# Patient Record
Sex: Male | Born: 1950 | Race: Black or African American | Hispanic: No | Marital: Married | Smoking: Never smoker
Health system: Southern US, Community
[De-identification: ages and names within clinical notes are randomized; demographics above are authoritative.]

## PROBLEM LIST (undated history)

## (undated) DIAGNOSIS — G4730 Sleep apnea, unspecified: Secondary | ICD-10-CM

## (undated) DIAGNOSIS — I10 Essential (primary) hypertension: Secondary | ICD-10-CM

## (undated) HISTORY — PX: CORONARY ARTERY BYPASS GRAFT: SHX141

## (undated) HISTORY — DX: Sleep apnea, unspecified: G47.30

---

## 1998-09-16 ENCOUNTER — Encounter: Payer: Self-pay | Admitting: Family Medicine

## 1998-09-16 ENCOUNTER — Ambulatory Visit (HOSPITAL_COMMUNITY): Admission: RE | Admit: 1998-09-16 | Discharge: 1998-09-16 | Payer: Self-pay | Admitting: Family Medicine

## 2001-05-23 ENCOUNTER — Encounter: Admission: RE | Admit: 2001-05-23 | Discharge: 2001-08-21 | Payer: Self-pay | Admitting: Family Medicine

## 2002-01-21 ENCOUNTER — Ambulatory Visit (HOSPITAL_COMMUNITY): Admission: RE | Admit: 2002-01-21 | Discharge: 2002-01-21 | Payer: Self-pay | Admitting: Ophthalmology

## 2002-01-21 ENCOUNTER — Encounter: Payer: Self-pay | Admitting: Family Medicine

## 2002-01-29 ENCOUNTER — Encounter: Payer: Self-pay | Admitting: Family Medicine

## 2002-01-29 ENCOUNTER — Ambulatory Visit (HOSPITAL_COMMUNITY): Admission: RE | Admit: 2002-01-29 | Discharge: 2002-01-29 | Payer: Self-pay | Admitting: Family Medicine

## 2002-02-25 ENCOUNTER — Encounter: Payer: Self-pay | Admitting: Family Medicine

## 2002-02-25 ENCOUNTER — Ambulatory Visit (HOSPITAL_COMMUNITY): Admission: RE | Admit: 2002-02-25 | Discharge: 2002-02-25 | Payer: Self-pay | Admitting: Family Medicine

## 2003-06-23 ENCOUNTER — Encounter: Admission: RE | Admit: 2003-06-23 | Discharge: 2003-09-21 | Payer: Self-pay | Admitting: Family Medicine

## 2003-07-04 ENCOUNTER — Ambulatory Visit (HOSPITAL_COMMUNITY): Admission: RE | Admit: 2003-07-04 | Discharge: 2003-07-04 | Payer: Self-pay | Admitting: Family Medicine

## 2004-06-07 ENCOUNTER — Encounter: Admission: RE | Admit: 2004-06-07 | Discharge: 2004-06-07 | Payer: Self-pay | Admitting: Family Medicine

## 2010-11-03 ENCOUNTER — Other Ambulatory Visit: Payer: Self-pay | Admitting: Gastroenterology

## 2010-11-08 ENCOUNTER — Ambulatory Visit
Admission: RE | Admit: 2010-11-08 | Discharge: 2010-11-08 | Disposition: A | Discharge status: Home / Self Care | Payer: 59 | Source: Ambulatory Visit | Attending: Gastroenterology | Admitting: Gastroenterology

## 2012-02-10 ENCOUNTER — Encounter (HOSPITAL_COMMUNITY): Payer: Self-pay | Admitting: *Deleted

## 2012-02-10 ENCOUNTER — Emergency Department (HOSPITAL_COMMUNITY): Payer: BC Managed Care – PPO

## 2012-02-10 ENCOUNTER — Emergency Department (HOSPITAL_COMMUNITY)
Admission: EM | Admit: 2012-02-10 | Discharge: 2012-02-10 | Disposition: A | Discharge status: Home / Self Care | Payer: BC Managed Care – PPO | Attending: Emergency Medicine | Admitting: Emergency Medicine

## 2012-02-10 DIAGNOSIS — S61209A Unspecified open wound of unspecified finger without damage to nail, initial encounter: Secondary | ICD-10-CM | POA: Insufficient documentation

## 2012-02-10 DIAGNOSIS — S61019A Laceration without foreign body of unspecified thumb without damage to nail, initial encounter: Secondary | ICD-10-CM

## 2012-02-10 DIAGNOSIS — S6990XA Unspecified injury of unspecified wrist, hand and finger(s), initial encounter: Secondary | ICD-10-CM

## 2012-02-10 DIAGNOSIS — W298XXA Contact with other powered powered hand tools and household machinery, initial encounter: Secondary | ICD-10-CM | POA: Insufficient documentation

## 2012-02-10 HISTORY — DX: Essential (primary) hypertension: I10

## 2012-02-10 MED ORDER — TETANUS-DIPHTHERIA TOXOIDS TD 5-2 LFU IM INJ
0.5000 mL | INJECTION | Freq: Once | INTRAMUSCULAR | Status: AC
  Administered 2012-02-10: 0.5 mL via INTRAMUSCULAR
  Filled 2012-02-10: qty 0.5

## 2012-02-10 MED ORDER — HYDROCODONE-ACETAMINOPHEN 5-325 MG PO TABS: 1.0000 | ORAL_TABLET | Freq: Four times a day (QID) | ORAL | Status: AC | PRN

## 2012-02-10 MED ORDER — HYDROCODONE-ACETAMINOPHEN 5-325 MG PO TABS
1.0000 | ORAL_TABLET | Freq: Once | ORAL | Status: AC
  Administered 2012-02-10: 1 via ORAL
  Filled 2012-02-10: qty 1

## 2012-02-10 NOTE — ED Provider Notes (Signed)
History   This chart was scribed for Celene Kras, MD by Shari Heritage. The patient was seen in room TR05C/TR05C. Patient's care was started at 1506.     CSN: [id number]  Arrival date & time 02/10/12  1506   None     Chief Complaint  Patient presents with  . Extremity Laceration    (Consider location/radiation/quality/duration/timing/severity/associated sxs/prior treatment) Patient is a 61 y.o. male presenting with skin laceration. The history is provided by the patient. No language interpreter was used.  Laceration  The incident occurred less than 1 hour ago. The laceration is located on the left hand and right hand. The laceration is 1 cm in size. The laceration mechanism was a a metal edge Child psychotherapist). The pain is moderate. The pain has been constant since onset. He reports no foreign bodies present. His tetanus status is unknown.   Brandon Melendez is a 61 y.o. male who presents to the Emergency Department complaining of laceration to thumbs bilaterally. Patient has a laceration under nail of right thumb and laceration on top of nail of left thumb. Patient's wife was cutting hedges with an Education officer, museum. Patient says that hedge trimmers were fully engaged and plugged into a power source when he was cut. Patient says his tetanus is questionably UTD. Patient with h/o of HTN and diabetes. Patient has never smoked.  Past Medical History  Diagnosis Date  . Hypertension   . Diabetes mellitus     No past surgical history on file.  No family history on file.  History  Substance Use Topics  . Smoking status: Never Smoker   . Smokeless tobacco: Not on file  . Alcohol Use: No      Review of Systems  Constitutional: Negative for fever.  HENT: Negative for neck pain.   Eyes: Negative for visual disturbance.  Respiratory: Negative for cough.   Cardiovascular: Negative for chest pain.  Gastrointestinal: Negative for abdominal pain.  Genitourinary: Negative for  frequency.  Musculoskeletal: Negative for back pain.  Skin: Positive for wound.  Neurological: Negative for headaches.  Psychiatric/Behavioral: Negative for confusion.    Allergies  Review of patient's allergies indicates no known allergies.  Home Medications   Current Outpatient Rx  Name Route Sig Dispense Refill  . ATORVASTATIN CALCIUM 10 MG PO TABS Oral Take 10 mg by mouth daily.    Marland Kitchen GLIMEPIRIDE 4 MG PO TABS Oral Take 4 mg by mouth 2 (two) times daily.    Marland Kitchen LISINOPRIL 20 MG PO TABS Oral Take 20 mg by mouth daily.    Marland Kitchen METFORMIN HCL 500 MG PO TABS Oral Take 1,000 mg by mouth 2 (two) times daily with a meal.    . JANUVIA PO Oral Take 1 tablet by mouth daily.      BP 147/79  Pulse 114  Temp 98.4 F (36.9 C) (Oral)  Resp 14  SpO2 97%  Physical Exam  Nursing note and vitals reviewed. Constitutional: He appears well-developed and well-nourished. No distress.  HENT:  Head: Normocephalic and atraumatic.  Right Ear: External ear normal.  Left Ear: External ear normal.  Eyes: Conjunctivae are normal. Right eye exhibits no discharge. Left eye exhibits no discharge. No scleral icterus.  Neck: Neck supple. No tracheal deviation present.  Cardiovascular: Normal rate.   Pulmonary/Chest: Effort normal. No stridor. No respiratory distress.  Musculoskeletal: He exhibits no edema.       Hands:      Both thumbs with injury to nail with superficial laceration  of the distal aspect of the thumb and nail matrix. Portion of nail avulsed on left thumbnail.  Neurological: He is alert. Cranial nerve deficit: no gross deficits.  Skin: Skin is warm and dry. No rash noted.  Psychiatric: He has a normal mood and affect.    ED Course  Procedures (including critical care time) DIAGNOSTIC STUDIES: Oxygen Saturation is 97% on room air, adequate by my interpretation.    COORDINATION OF CARE: 3:50PM- Patient informed of current plan for treatment and evaluation and agrees with plan at this time.  Will order X-ray of hands to check for fractures. Will also prescribe pain medication.  5:08PM- Performed clipping of fingernails of thumbs bilaterally and jagged edges were reduced.   Labs Reviewed - No data to display  Dg Finger Thumb Left  02/10/2012  *RADIOLOGY REPORT*  Clinical Data: Laceration.  LEFT THUMB 2+V  Comparison: None.  Findings: No acute osseous or joint abnormality.  No radiopaque foreign body.  IMPRESSION: No acute findings.  Original Report Authenticated By: Reyes Ivan, M.D.   Dg Finger Thumb Right  02/10/2012  *RADIOLOGY REPORT*  Clinical Data: Laceration.  RIGHT THUMB 2+V  Comparison: None.  Findings: No acute osseous or joint abnormality.  No radiopaque foreign body.  IMPRESSION: No acute findings.  Original Report Authenticated By: Reyes Ivan, M.D.     1. Fingernail injury   2. Thumb laceration       MDM  No need for suture.  No fracture.  Local wound care provided and nails debrided.      I personally performed the services described in this documentation, which was scribed in my presence.  The recorded information has been reviewed and considered.    Celene Kras, MD 02/10/12 [phone]

## 2012-02-10 NOTE — ED Notes (Signed)
Lacerated both thumbs with a hedge trimmer while blade was engaged. - rt. Thumb: lac. Under thumb nail; lt. Thumb: lac. On top of nail. Controlled bleeding.

## 2013-05-04 IMAGING — CR DG FINGER THUMB 2+V*L*
3 series · 3 of 3 positions shown · non-contrast
Comparison: None.

CLINICAL DATA: Laceration.

LEFT THUMB 2+V

[x finger pa left]
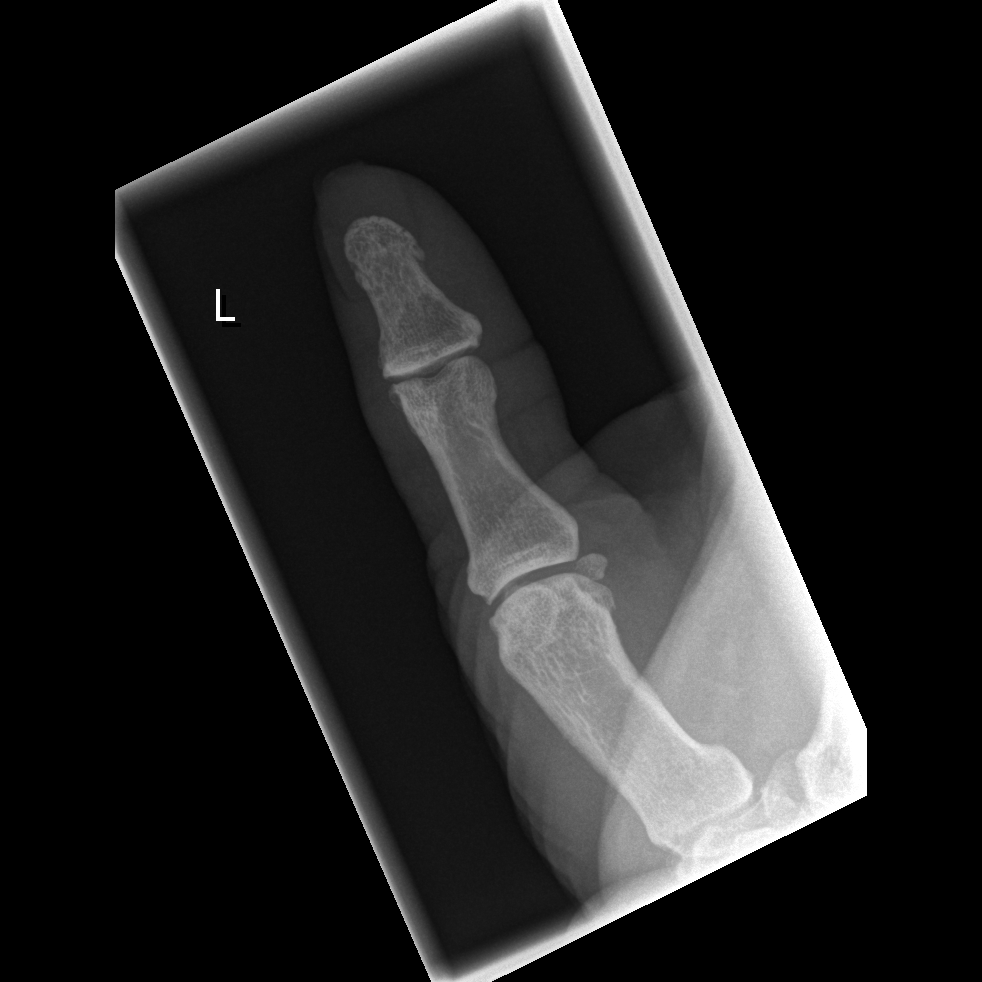

[x finger obl. left]
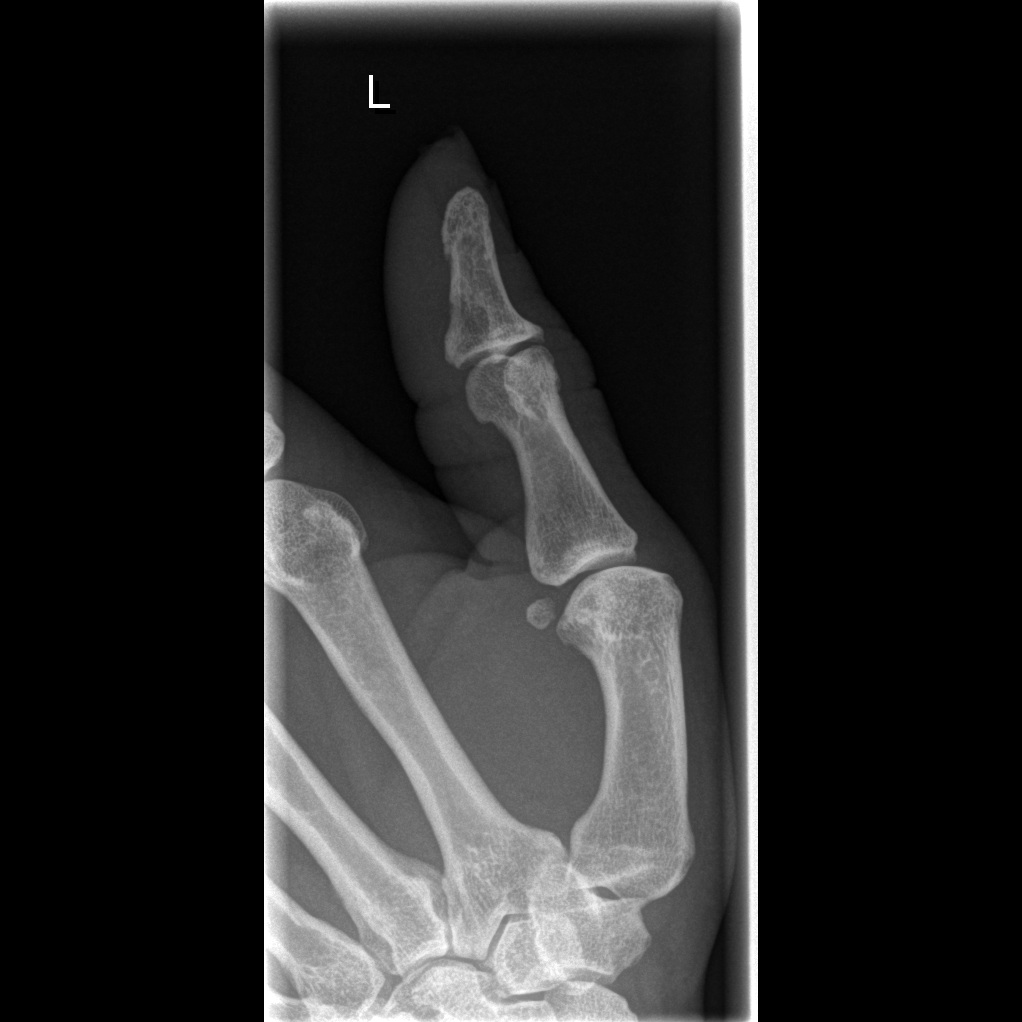

[x finger lateral left]
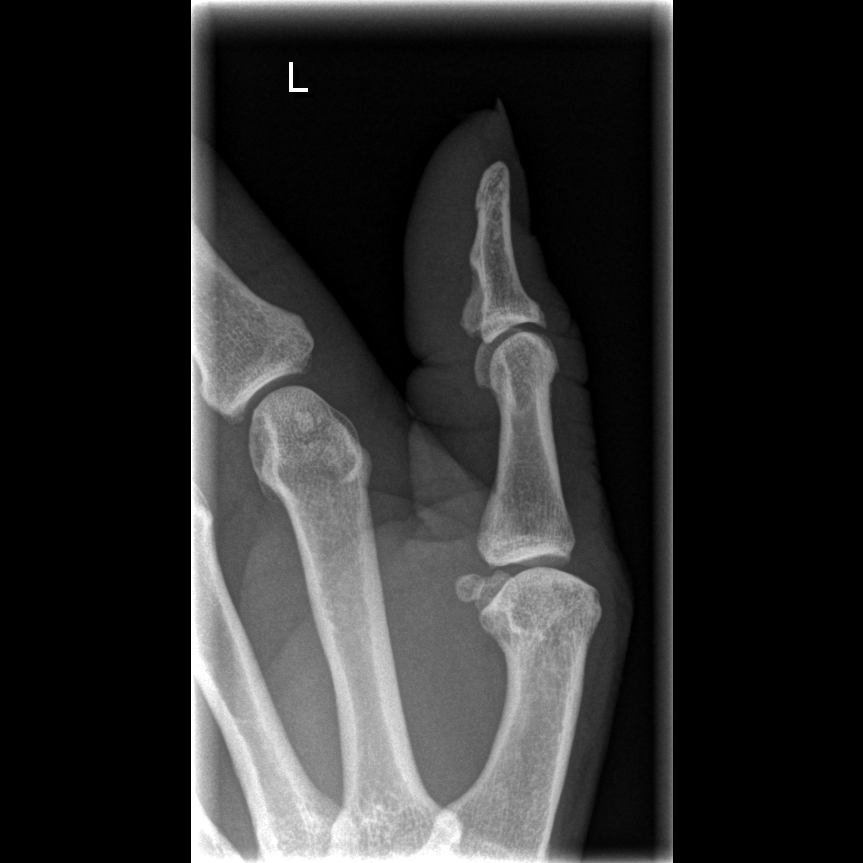

[3 of 3 positions shown; findings below may reference images not displayed]

FINDINGS: No acute osseous or joint abnormality.  No radiopaque
foreign body.
IMPRESSION: No acute findings.

## 2013-10-10 ENCOUNTER — Encounter: Payer: Self-pay | Admitting: Dietician

## 2013-10-10 ENCOUNTER — Encounter: Payer: BC Managed Care – PPO | Attending: Family Medicine | Admitting: Dietician

## 2013-10-10 VITALS — Ht 68.5 in | Wt 207.3 lb

## 2013-10-10 DIAGNOSIS — Z713 Dietary counseling and surveillance: Secondary | ICD-10-CM | POA: Insufficient documentation

## 2013-10-10 DIAGNOSIS — E119 Type 2 diabetes mellitus without complications: Secondary | ICD-10-CM | POA: Insufficient documentation

## 2013-10-10 NOTE — Patient Instructions (Addendum)
-  Watch portions; read labels -Eat more consistently, develop a routine. Keep snacks on hand.  -1 serving of carb = 15 grams  -3 servings of carb per meal -1 serving of carb at snacks  -Keep a food record  -Get back into a physical activity routine

## 2013-10-10 NOTE — Progress Notes (Signed)
  Medical Nutrition Therapy:  Appt start time: 0830 end time:  1000.   Assessment:  Primary concerns today: Brandon Melendez is here today in hopes of improving his "bad eating habits" and getting his diabetes under better control. He reports that he was diagnosed with diabetes around the year 1999 or 2000. He seems to have a good understanding of the disease process of diabetes and the negative effects of uncontrolled diabetes, as he has had diabetes education in the past. He states that he is currently participating in an insulin study. Per his report, Brandon Melendez has an inconsistent eating schedule. 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 He has a sedentary lifestyle and does not do any physical activity. Brandon Melendez reports he was in the [employer] for 34 years.  Checks blood sugars 1 time a day in the morning: average 125-135, some highs over 200 and 1 low below 70.  Preferred Learning Style:  No preference indicated   Learning Readiness:  Contemplating  MEDICATIONS: see list; participating in an insulin study  Usual physical activity: none  Estimated energy needs: 2000 calories 225 g carbohydrates  Progress Towards Goal(s):  No progress.   Nutritional Diagnosis:  [place]-2.2 Altered nutrition-related laboratory As related to excessive carbohydrate intake, overweight, physical inactivity.  As evidenced by HgbA1c above normal limits per patient report.    Intervention:  Nutrition counseling provided.  Goals: -Watch portions; read labels -Eat more consistently, develop a routine. Keep snacks on hand. -1 serving of carb = 15 grams -3 servings of carb per meal -1 serving of carb at snacks -Keep a food record -Get back into a physical activity routine  Teaching Method Utilized: Visual Auditory Hands on  Handouts given during visit include:  Living Well with Diabetes booklet  15g CHO + protein snacks  Nutrition Label  Barriers to learning/adherence to  lifestyle change: food preferences  Demonstrated degree of understanding via:  Teach Back   Monitoring/Evaluation:  Dietary intake, exercise, carb counting, and body weight in 4 week(s).

## 2013-10-22 ENCOUNTER — Ambulatory Visit: Payer: BC Managed Care – PPO | Admitting: Dietician

## 2013-11-08 ENCOUNTER — Ambulatory Visit: Payer: BC Managed Care – PPO | Admitting: Dietician

## 2013-11-12 ENCOUNTER — Ambulatory Visit: Payer: BC Managed Care – PPO | Admitting: Dietician

## 2014-02-06 ENCOUNTER — Other Ambulatory Visit (HOSPITAL_COMMUNITY): Payer: Self-pay | Admitting: Orthopedic Surgery

## 2014-02-06 DIAGNOSIS — M7989 Other specified soft tissue disorders: Secondary | ICD-10-CM

## 2014-02-06 DIAGNOSIS — M79605 Pain in left leg: Secondary | ICD-10-CM

## 2014-02-07 ENCOUNTER — Ambulatory Visit (HOSPITAL_COMMUNITY)
Admission: RE | Admit: 2014-02-07 | Discharge: 2014-02-07 | Disposition: A | Discharge status: Home / Self Care | Payer: BC Managed Care – PPO | Source: Ambulatory Visit | Attending: Orthopedic Surgery | Admitting: Orthopedic Surgery

## 2014-02-07 DIAGNOSIS — M79609 Pain in unspecified limb: Secondary | ICD-10-CM | POA: Insufficient documentation

## 2014-02-07 DIAGNOSIS — M7989 Other specified soft tissue disorders: Secondary | ICD-10-CM

## 2014-02-07 DIAGNOSIS — M79605 Pain in left leg: Secondary | ICD-10-CM

## 2014-02-07 NOTE — Progress Notes (Signed)
*  PRELIMINARY RESULTS* Vascular Ultrasound Right lower extremity venous duplex has been completed.  Preliminary findings: no evidence of DVT or superficial thrombosis. Area of fluid collection is noted in right mid calf, consistent with muscle tear.   Called results.  Farrel DemarkJill Eunice, RDMS, RVT  02/07/2014, 10:33 AM

## 2014-11-06 ENCOUNTER — Encounter (HOSPITAL_COMMUNITY)
Admission: RE | Admit: 2014-11-06 | Discharge: 2014-11-06 | Disposition: A | Discharge status: Home / Self Care | Payer: Non-veteran care | Source: Ambulatory Visit | Attending: Cardiology | Admitting: Cardiology

## 2014-11-06 DIAGNOSIS — E119 Type 2 diabetes mellitus without complications: Secondary | ICD-10-CM | POA: Insufficient documentation

## 2014-11-06 DIAGNOSIS — Z48812 Encounter for surgical aftercare following surgery on the circulatory system: Secondary | ICD-10-CM | POA: Insufficient documentation

## 2014-11-06 DIAGNOSIS — Z794 Long term (current) use of insulin: Secondary | ICD-10-CM | POA: Insufficient documentation

## 2014-11-06 DIAGNOSIS — Z79899 Other long term (current) drug therapy: Secondary | ICD-10-CM | POA: Insufficient documentation

## 2014-11-06 DIAGNOSIS — Z951 Presence of aortocoronary bypass graft: Secondary | ICD-10-CM | POA: Insufficient documentation

## 2014-11-06 NOTE — Progress Notes (Signed)
Cardiac Rehab Medication Review by a Pharmacist  Does the patient  feel that his/her medications are working for him/her?  yes  Has the patient been experiencing any side effects to the medications prescribed?  no  Does the patient measure his/her own blood pressure or blood glucose at home?  yes   Does the patient have any problems obtaining medications due to transportation or finances?   no  Understanding of regimen: good Understanding of indications: excellent Potential of compliance: good  Pharmacist comments: Patient has no barriers to obtaining his medications. He does check his blood sugar at home, but not his blood pressure.  His medications have been updated in the EMR.  He is not experiencing any side effects. He has no questions for me.  Cassie L. Roseanne RenoStewart, PharmD Clinical Pharmacy Resident Pager: [phone] 11/06/2014 8:56 AM

## 2014-11-12 ENCOUNTER — Encounter (HOSPITAL_COMMUNITY)
Admission: RE | Admit: 2014-11-12 | Discharge: 2014-11-12 | Disposition: A | Discharge status: Home / Self Care | Payer: Non-veteran care | Source: Ambulatory Visit | Attending: Cardiology | Admitting: Cardiology

## 2014-11-12 DIAGNOSIS — Z951 Presence of aortocoronary bypass graft: Secondary | ICD-10-CM | POA: Diagnosis not present

## 2014-11-12 DIAGNOSIS — Z79899 Other long term (current) drug therapy: Secondary | ICD-10-CM | POA: Diagnosis not present

## 2014-11-12 DIAGNOSIS — E119 Type 2 diabetes mellitus without complications: Secondary | ICD-10-CM | POA: Diagnosis not present

## 2014-11-12 DIAGNOSIS — Z48812 Encounter for surgical aftercare following surgery on the circulatory system: Secondary | ICD-10-CM | POA: Diagnosis not present

## 2014-11-12 DIAGNOSIS — Z794 Long term (current) use of insulin: Secondary | ICD-10-CM | POA: Diagnosis not present

## 2014-11-12 LAB — GLUCOSE, CAPILLARY
GLUCOSE-CAPILLARY: 121 mg/dL — AB (ref 70–99)
Glucose-Capillary: 105 mg/dL — ABNORMAL HIGH (ref 70–99)

## 2014-11-12 NOTE — Progress Notes (Signed)
Pt started cardiac rehab today.  Pt tolerated light exercise without difficulty. Telemetry rhythm Sinus. Vital signs stable. Brandon Melendez's short term goals are to lose weight. Brandon Melendez's long term goals are to lose 10-15 pounds, lower his AIC, improve diet and get down to 175 pounds. PHQ=0. Will continue to monitor the patient throughout  the program.

## 2014-11-14 ENCOUNTER — Encounter (HOSPITAL_COMMUNITY)
Admission: RE | Admit: 2014-11-14 | Discharge: 2014-11-14 | Disposition: A | Discharge status: Home / Self Care | Payer: Non-veteran care | Source: Ambulatory Visit | Attending: Cardiology | Admitting: Cardiology

## 2014-11-14 DIAGNOSIS — Z48812 Encounter for surgical aftercare following surgery on the circulatory system: Secondary | ICD-10-CM | POA: Diagnosis not present

## 2014-11-14 LAB — GLUCOSE, CAPILLARY
GLUCOSE-CAPILLARY: 122 mg/dL — AB (ref 70–99)
Glucose-Capillary: 66 mg/dL — ABNORMAL LOW (ref 70–99)
Glucose-Capillary: 71 mg/dL (ref 70–99)

## 2014-11-17 ENCOUNTER — Encounter (HOSPITAL_COMMUNITY)
Admission: RE | Admit: 2014-11-17 | Discharge: 2014-11-17 | Disposition: A | Discharge status: Home / Self Care | Payer: Non-veteran care | Source: Ambulatory Visit | Attending: Cardiology | Admitting: Cardiology

## 2014-11-17 DIAGNOSIS — Z48812 Encounter for surgical aftercare following surgery on the circulatory system: Secondary | ICD-10-CM | POA: Diagnosis not present

## 2014-11-17 LAB — GLUCOSE, CAPILLARY
GLUCOSE-CAPILLARY: 188 mg/dL — AB (ref 70–99)
GLUCOSE-CAPILLARY: 178 mg/dL — AB (ref 70–99)

## 2014-11-17 NOTE — Progress Notes (Signed)
Academic intern reviewed home exercise with pt today.  Pt plans to ... For exercise.  Reviewed THR, pulse, RPE, sign and symptoms, and when to call 911 or MD.  Pt voiced understanding. Debby FreibergLee Griswold, Academic Intern Fabio PierceJessica Leylani Duley, MA, ACSM RCEP

## 2014-11-19 ENCOUNTER — Encounter (HOSPITAL_COMMUNITY)
Admission: RE | Admit: 2014-11-19 | Discharge: 2014-11-19 | Disposition: A | Discharge status: Home / Self Care | Payer: Non-veteran care | Source: Ambulatory Visit | Attending: Cardiology | Admitting: Cardiology

## 2014-11-19 DIAGNOSIS — Z48812 Encounter for surgical aftercare following surgery on the circulatory system: Secondary | ICD-10-CM | POA: Diagnosis not present

## 2014-11-19 LAB — GLUCOSE, CAPILLARY
Glucose-Capillary: 68 mg/dL — ABNORMAL LOW (ref 70–99)
Glucose-Capillary: 80 mg/dL (ref 70–99)
Glucose-Capillary: 81 mg/dL (ref 70–99)

## 2014-11-19 NOTE — Progress Notes (Addendum)
CBG 68. Patient asymptomatic given a ginger ale. Repeat CBG 80. Patient did not exercise today per protocol. Patient was counseled by the dietitian. Patient was also given some graham crackers. Recheck CBG=81. [name] low CB[facility] this afternoon. Will fax exercise flow sheets [facility]. Patient advised to check CBG before coming to exercise on Friday.Patient states understanding. Psychosocial assessment: [name]'s father passed away this past November. Patient is not grieving at this time. Will continue to provide emotional support as needed.

## 2014-11-19 NOTE — Progress Notes (Signed)
Nutrition Note Spoke with pt. Pt pre-exercise CBG too low to exercise after eating a pork chop and potatoes with gravy and greens. Pt reports injecting 4 units of insulin prior to eating. Pre-meal CBG was reportedly 108 mg/dL. If pre-exercise CBG's remain too low despite eating, consider having pt hold insulin before lunch on exercise days. Pt expressed understanding of the information reviewed. Continue client-centered nutrition education by RD as part of interdisciplinary care.  Monitor and evaluate progress toward nutrition goal with team.  Mickle PlumbEdna Roan Miklos, M.Ed, RD, LDN, CDE 11/19/2014 4:03 PM

## 2014-11-21 ENCOUNTER — Encounter (HOSPITAL_COMMUNITY)
Admission: RE | Admit: 2014-11-21 | Discharge: 2014-11-21 | Disposition: A | Discharge status: Home / Self Care | Payer: Non-veteran care | Source: Ambulatory Visit | Attending: Cardiology | Admitting: Cardiology

## 2014-11-21 DIAGNOSIS — Z48812 Encounter for surgical aftercare following surgery on the circulatory system: Secondary | ICD-10-CM | POA: Diagnosis not present

## 2014-11-21 LAB — GLUCOSE, CAPILLARY
GLUCOSE-CAPILLARY: 133 mg/dL — AB (ref 70–99)
Glucose-Capillary: 166 mg/dL — ABNORMAL HIGH (ref 70–99)

## 2014-11-24 ENCOUNTER — Encounter (HOSPITAL_COMMUNITY)
Admission: RE | Admit: 2014-11-24 | Discharge: 2014-11-24 | Disposition: A | Discharge status: Home / Self Care | Payer: Non-veteran care | Source: Ambulatory Visit | Attending: Cardiology | Admitting: Cardiology

## 2014-11-24 DIAGNOSIS — Z48812 Encounter for surgical aftercare following surgery on the circulatory system: Secondary | ICD-10-CM | POA: Diagnosis not present

## 2014-11-24 LAB — GLUCOSE, CAPILLARY
GLUCOSE-CAPILLARY: 116 mg/dL — AB (ref 70–99)
Glucose-Capillary: 147 mg/dL — ABNORMAL HIGH (ref 70–99)

## 2014-11-26 ENCOUNTER — Encounter (HOSPITAL_COMMUNITY)
Admission: RE | Admit: 2014-11-26 | Discharge: 2014-11-26 | Disposition: A | Discharge status: Home / Self Care | Payer: Non-veteran care | Source: Ambulatory Visit | Attending: Cardiology | Admitting: Cardiology

## 2014-11-26 DIAGNOSIS — Z48812 Encounter for surgical aftercare following surgery on the circulatory system: Secondary | ICD-10-CM | POA: Diagnosis not present

## 2014-11-26 LAB — GLUCOSE, CAPILLARY
GLUCOSE-CAPILLARY: 83 mg/dL (ref 70–99)
GLUCOSE-CAPILLARY: 99 mg/dL (ref 70–99)

## 2014-11-26 NOTE — Progress Notes (Signed)
Nutrition Note Spoke with pt. Pt pre-exercise CBG 83 mg/dL after eating pancakes, syrup, [place] bacon, and juice. Pt held his Novolog before lunch. DM medications discussed. Pt is going to try holding Metformin and Novolog on Friday before exercise. Pt to call with CBG prior to coming to cardiac rehab. If holding Metformin and Novolog is not effective, consider decreasing bedtime dose of Lantus from 25 units to 20. Pt expressed understanding of the information reviewed. Brandon LighterMaria Whitaker, RN is awarContinue client-centered nutrition education by RD as part of interdisciplinary care.  Monitor and evaluate progress toward nutrition goal with team.  Brandon PlumbEdna Mikiah Melendez, M.Ed, RD, LDN, CDE 11/26/2014 3:44 PM

## 2014-11-26 NOTE — Progress Notes (Signed)
Patient CBG this afternoon was 83. No exercise today per protocol. Patient given lemonade and a banana. Recheck CBG 99. Patient plans to return to exercise on Friday. Patient was counseled by the dietitian.

## 2014-11-28 ENCOUNTER — Encounter (HOSPITAL_COMMUNITY)
Admission: RE | Admit: 2014-11-28 | Discharge: 2014-11-28 | Disposition: A | Discharge status: Home / Self Care | Payer: Non-veteran care | Source: Ambulatory Visit | Attending: Cardiology | Admitting: Cardiology

## 2014-11-28 DIAGNOSIS — Z48812 Encounter for surgical aftercare following surgery on the circulatory system: Secondary | ICD-10-CM | POA: Diagnosis not present

## 2014-11-28 LAB — GLUCOSE, CAPILLARY
GLUCOSE-CAPILLARY: 225 mg/dL — AB (ref 70–99)
Glucose-Capillary: 185 mg/dL — ABNORMAL HIGH (ref 70–99)

## 2014-12-01 ENCOUNTER — Encounter (HOSPITAL_COMMUNITY)
Admission: RE | Admit: 2014-12-01 | Discharge: 2014-12-01 | Disposition: A | Discharge status: Home / Self Care | Payer: Non-veteran care | Source: Ambulatory Visit | Attending: Cardiology | Admitting: Cardiology

## 2014-12-01 DIAGNOSIS — Z794 Long term (current) use of insulin: Secondary | ICD-10-CM | POA: Diagnosis not present

## 2014-12-01 DIAGNOSIS — Z48812 Encounter for surgical aftercare following surgery on the circulatory system: Secondary | ICD-10-CM | POA: Diagnosis present

## 2014-12-01 DIAGNOSIS — Z951 Presence of aortocoronary bypass graft: Secondary | ICD-10-CM | POA: Diagnosis not present

## 2014-12-01 DIAGNOSIS — E119 Type 2 diabetes mellitus without complications: Secondary | ICD-10-CM | POA: Diagnosis not present

## 2014-12-01 DIAGNOSIS — Z79899 Other long term (current) drug therapy: Secondary | ICD-10-CM | POA: Diagnosis not present

## 2014-12-03 ENCOUNTER — Encounter (HOSPITAL_COMMUNITY)
Admission: RE | Admit: 2014-12-03 | Discharge: 2014-12-03 | Disposition: A | Discharge status: Home / Self Care | Payer: Non-veteran care | Source: Ambulatory Visit | Attending: Cardiology | Admitting: Cardiology

## 2014-12-03 DIAGNOSIS — Z48812 Encounter for surgical aftercare following surgery on the circulatory system: Secondary | ICD-10-CM | POA: Diagnosis not present

## 2014-12-05 ENCOUNTER — Encounter (HOSPITAL_COMMUNITY)
Admission: RE | Admit: 2014-12-05 | Discharge: 2014-12-05 | Disposition: A | Discharge status: Home / Self Care | Payer: Non-veteran care | Source: Ambulatory Visit | Attending: Cardiology | Admitting: Cardiology

## 2014-12-05 DIAGNOSIS — Z48812 Encounter for surgical aftercare following surgery on the circulatory system: Secondary | ICD-10-CM | POA: Diagnosis not present

## 2014-12-08 ENCOUNTER — Encounter (HOSPITAL_COMMUNITY)
Admission: RE | Admit: 2014-12-08 | Discharge: 2014-12-08 | Disposition: A | Discharge status: Home / Self Care | Payer: Non-veteran care | Source: Ambulatory Visit | Attending: Cardiology | Admitting: Cardiology

## 2014-12-08 DIAGNOSIS — Z48812 Encounter for surgical aftercare following surgery on the circulatory system: Secondary | ICD-10-CM | POA: Diagnosis not present

## 2014-12-10 ENCOUNTER — Encounter (HOSPITAL_COMMUNITY)
Admission: RE | Admit: 2014-12-10 | Discharge: 2014-12-10 | Disposition: A | Discharge status: Home / Self Care | Payer: Non-veteran care | Source: Ambulatory Visit | Attending: Cardiology | Admitting: Cardiology

## 2014-12-10 DIAGNOSIS — Z48812 Encounter for surgical aftercare following surgery on the circulatory system: Secondary | ICD-10-CM | POA: Diagnosis not present

## 2014-12-12 ENCOUNTER — Encounter (HOSPITAL_COMMUNITY)
Admission: RE | Admit: 2014-12-12 | Discharge: 2014-12-12 | Disposition: A | Discharge status: Home / Self Care | Payer: Non-veteran care | Source: Ambulatory Visit | Attending: Cardiology | Admitting: Cardiology

## 2014-12-12 DIAGNOSIS — Z48812 Encounter for surgical aftercare following surgery on the circulatory system: Secondary | ICD-10-CM | POA: Diagnosis not present

## 2014-12-15 ENCOUNTER — Encounter (HOSPITAL_COMMUNITY)
Admission: RE | Admit: 2014-12-15 | Discharge: 2014-12-15 | Disposition: A | Discharge status: Home / Self Care | Payer: Non-veteran care | Source: Ambulatory Visit | Attending: Cardiology | Admitting: Cardiology

## 2014-12-15 DIAGNOSIS — Z48812 Encounter for surgical aftercare following surgery on the circulatory system: Secondary | ICD-10-CM | POA: Diagnosis not present

## 2014-12-15 NOTE — Progress Notes (Signed)
Brandon Melendez 64 y.o. male Nutrition Note Spoke with pt. Nutrition Plan and Nutrition Survey goals reviewed with pt. Pt is following Step 1 of the Therapeutic Lifestyle Changes diet. Pt wants to lose wt. Per pt, pre-op UBW was 217 lb. Pt wt today 84.7 kg, which is down 3.9 kg (8.6 lb) over the past 4 weeks. Rate of wt loss appears safe. Wt loss tips reviewed.  Pt is diabetic. Last A1c indicates blood glucose well-controlled. Pt states his A1c re-check 2-3 weeks ago was 5.7. Pt is now holding his Novolog on exercise days. Pt checks CBG's TID. Fasting CBG's reportedly "in the 90's." This writer went over Diabetes Education test results. Pt expressed understanding of the information reviewed. Pt aware of nutrition education classes offered and plans on attending nutrition classes. No results found for: HGBA1C Nutrition Diagnosis ? Food-and nutrition-related knowledge deficit related to lack of exposure to information as related to diagnosis of: ? CVD ? DM  ? Overweight related to excessive energy intake as evidenced by a BMI of 29.9  Nutrition RX/ Estimated Daily Nutrition Needs for: wt loss 1500-2000 Kcal, 40-55 gm fat, 9-13 gm sat fat, 1.4-2.0 gm trans-fat, <1500 mg sodium, 175-250 gm CHO   Nutrition Intervention ? Pt's individual nutrition plan reviewed with pt. ? Benefits of adopting Therapeutic Lifestyle Changes discussed when Medficts reviewed. ? Pt to attend the Portion Distortion class ? Pt to attend the Diabetes Q & A class ? Pt to attend the   ? Nutrition I class                   ? Nutrition II class     ? Diabetes Blitz class ? Continue client-centered nutrition education by RD, as part of interdisciplinary care. Goal(s) ? Pt to identify and limit food sources of saturated fat, trans fat, and cholesterol ? Pt to identify food quantities necessary to achieve: ? wt loss to a goal wt of 175-180 lb (79.5-81.8 kg) at graduation from cardiac rehab.  ? Use pre-meal and post-meal CBG's and A1c  to determine whether adjustments in food/meal planning will be beneficial or if any meds need to be combined with nutrition therapy. Monitor and Evaluate progress toward nutrition goal with team. Nutrition Risk: Change to Moderate Mickle PlumbEdna Caylah Plouff, M.Ed, RD, LDN, CDE 12/15/2014 3:41 PM

## 2014-12-17 ENCOUNTER — Encounter (HOSPITAL_COMMUNITY): Payer: Non-veteran care

## 2014-12-19 ENCOUNTER — Encounter (HOSPITAL_COMMUNITY)
Admission: RE | Admit: 2014-12-19 | Discharge: 2014-12-19 | Disposition: A | Discharge status: Home / Self Care | Payer: Non-veteran care | Source: Ambulatory Visit | Attending: Cardiology | Admitting: Cardiology

## 2014-12-19 DIAGNOSIS — Z48812 Encounter for surgical aftercare following surgery on the circulatory system: Secondary | ICD-10-CM | POA: Diagnosis not present

## 2014-12-22 ENCOUNTER — Encounter (HOSPITAL_COMMUNITY)
Admission: RE | Admit: 2014-12-22 | Discharge: 2014-12-22 | Disposition: A | Discharge status: Home / Self Care | Payer: Non-veteran care | Source: Ambulatory Visit | Attending: Cardiology | Admitting: Cardiology

## 2014-12-22 DIAGNOSIS — Z48812 Encounter for surgical aftercare following surgery on the circulatory system: Secondary | ICD-10-CM | POA: Diagnosis not present

## 2014-12-24 ENCOUNTER — Encounter (HOSPITAL_COMMUNITY)
Admission: RE | Admit: 2014-12-24 | Discharge: 2014-12-24 | Disposition: A | Discharge status: Home / Self Care | Payer: Non-veteran care | Source: Ambulatory Visit | Attending: Cardiology | Admitting: Cardiology

## 2014-12-24 DIAGNOSIS — Z48812 Encounter for surgical aftercare following surgery on the circulatory system: Secondary | ICD-10-CM | POA: Diagnosis not present

## 2014-12-26 ENCOUNTER — Encounter (HOSPITAL_COMMUNITY)
Admission: RE | Admit: 2014-12-26 | Discharge: 2014-12-26 | Disposition: A | Discharge status: Home / Self Care | Payer: Non-veteran care | Source: Ambulatory Visit | Attending: Cardiology | Admitting: Cardiology

## 2014-12-26 DIAGNOSIS — Z48812 Encounter for surgical aftercare following surgery on the circulatory system: Secondary | ICD-10-CM | POA: Diagnosis not present

## 2014-12-31 ENCOUNTER — Encounter (HOSPITAL_COMMUNITY)
Admission: RE | Admit: 2014-12-31 | Discharge: 2014-12-31 | Disposition: A | Discharge status: Home / Self Care | Payer: Non-veteran care | Source: Ambulatory Visit | Attending: Cardiology | Admitting: Cardiology

## 2014-12-31 DIAGNOSIS — Z951 Presence of aortocoronary bypass graft: Secondary | ICD-10-CM | POA: Insufficient documentation

## 2014-12-31 DIAGNOSIS — Z48812 Encounter for surgical aftercare following surgery on the circulatory system: Secondary | ICD-10-CM | POA: Insufficient documentation

## 2014-12-31 DIAGNOSIS — Z794 Long term (current) use of insulin: Secondary | ICD-10-CM | POA: Insufficient documentation

## 2014-12-31 DIAGNOSIS — Z79899 Other long term (current) drug therapy: Secondary | ICD-10-CM | POA: Insufficient documentation

## 2014-12-31 DIAGNOSIS — E119 Type 2 diabetes mellitus without complications: Secondary | ICD-10-CM | POA: Insufficient documentation

## 2015-01-02 ENCOUNTER — Encounter (HOSPITAL_COMMUNITY): Payer: Non-veteran care

## 2015-01-02 ENCOUNTER — Telehealth (HOSPITAL_COMMUNITY): Payer: Self-pay | Admitting: Family Medicine

## 2015-01-05 ENCOUNTER — Encounter (HOSPITAL_COMMUNITY)
Admission: RE | Admit: 2015-01-05 | Discharge: 2015-01-05 | Disposition: A | Discharge status: Home / Self Care | Payer: Non-veteran care | Source: Ambulatory Visit | Attending: Cardiology | Admitting: Cardiology

## 2015-01-05 DIAGNOSIS — Z48812 Encounter for surgical aftercare following surgery on the circulatory system: Secondary | ICD-10-CM | POA: Diagnosis not present

## 2015-01-07 ENCOUNTER — Encounter (HOSPITAL_COMMUNITY)
Admission: RE | Admit: 2015-01-07 | Discharge: 2015-01-07 | Disposition: A | Discharge status: Home / Self Care | Payer: Non-veteran care | Source: Ambulatory Visit | Attending: Cardiology | Admitting: Cardiology

## 2015-01-07 DIAGNOSIS — Z48812 Encounter for surgical aftercare following surgery on the circulatory system: Secondary | ICD-10-CM | POA: Diagnosis not present

## 2015-01-09 ENCOUNTER — Encounter (HOSPITAL_COMMUNITY)
Admission: RE | Admit: 2015-01-09 | Discharge: 2015-01-09 | Disposition: A | Discharge status: Home / Self Care | Payer: Non-veteran care | Source: Ambulatory Visit | Attending: Cardiology | Admitting: Cardiology

## 2015-01-09 DIAGNOSIS — Z48812 Encounter for surgical aftercare following surgery on the circulatory system: Secondary | ICD-10-CM | POA: Diagnosis not present

## 2015-01-12 ENCOUNTER — Encounter (HOSPITAL_COMMUNITY): Payer: Non-veteran care

## 2015-01-14 ENCOUNTER — Encounter (HOSPITAL_COMMUNITY)
Admission: RE | Admit: 2015-01-14 | Discharge: 2015-01-14 | Disposition: A | Discharge status: Home / Self Care | Payer: Non-veteran care | Source: Ambulatory Visit | Attending: Cardiology | Admitting: Cardiology

## 2015-01-14 DIAGNOSIS — Z48812 Encounter for surgical aftercare following surgery on the circulatory system: Secondary | ICD-10-CM | POA: Diagnosis not present

## 2015-01-16 ENCOUNTER — Encounter (HOSPITAL_COMMUNITY): Payer: Non-veteran care

## 2015-01-19 ENCOUNTER — Encounter (HOSPITAL_COMMUNITY)
Admission: RE | Admit: 2015-01-19 | Discharge: 2015-01-19 | Disposition: A | Discharge status: Home / Self Care | Payer: Non-veteran care | Source: Ambulatory Visit | Attending: Cardiology | Admitting: Cardiology

## 2015-01-19 DIAGNOSIS — Z48812 Encounter for surgical aftercare following surgery on the circulatory system: Secondary | ICD-10-CM | POA: Diagnosis not present

## 2015-01-21 ENCOUNTER — Encounter (HOSPITAL_COMMUNITY)
Admission: RE | Admit: 2015-01-21 | Discharge: 2015-01-21 | Disposition: A | Discharge status: Home / Self Care | Payer: Non-veteran care | Source: Ambulatory Visit | Attending: Cardiology | Admitting: Cardiology

## 2015-01-21 ENCOUNTER — Encounter (HOSPITAL_COMMUNITY): Payer: Non-veteran care

## 2015-01-23 ENCOUNTER — Encounter (HOSPITAL_COMMUNITY): Payer: Non-veteran care

## 2015-01-23 DIAGNOSIS — Z48812 Encounter for surgical aftercare following surgery on the circulatory system: Secondary | ICD-10-CM | POA: Diagnosis not present

## 2015-01-26 ENCOUNTER — Encounter (HOSPITAL_COMMUNITY)
Admission: RE | Admit: 2015-01-26 | Discharge: 2015-01-26 | Disposition: A | Discharge status: Home / Self Care | Payer: Non-veteran care | Source: Ambulatory Visit | Attending: Cardiology | Admitting: Cardiology

## 2015-01-26 DIAGNOSIS — Z48812 Encounter for surgical aftercare following surgery on the circulatory system: Secondary | ICD-10-CM | POA: Diagnosis not present

## 2015-01-28 ENCOUNTER — Encounter (HOSPITAL_COMMUNITY)
Admission: RE | Admit: 2015-01-28 | Discharge: 2015-01-28 | Disposition: A | Discharge status: Home / Self Care | Payer: Non-veteran care | Source: Ambulatory Visit | Attending: Cardiology | Admitting: Cardiology

## 2015-01-28 DIAGNOSIS — Z48812 Encounter for surgical aftercare following surgery on the circulatory system: Secondary | ICD-10-CM | POA: Diagnosis not present

## 2015-01-30 ENCOUNTER — Encounter (HOSPITAL_COMMUNITY)
Admission: RE | Admit: 2015-01-30 | Discharge: 2015-01-30 | Disposition: A | Discharge status: Home / Self Care | Payer: Non-veteran care | Source: Ambulatory Visit | Attending: Cardiology | Admitting: Cardiology

## 2015-01-30 DIAGNOSIS — Z79899 Other long term (current) drug therapy: Secondary | ICD-10-CM | POA: Diagnosis not present

## 2015-01-30 DIAGNOSIS — Z951 Presence of aortocoronary bypass graft: Secondary | ICD-10-CM | POA: Diagnosis not present

## 2015-01-30 DIAGNOSIS — Z794 Long term (current) use of insulin: Secondary | ICD-10-CM | POA: Diagnosis not present

## 2015-01-30 DIAGNOSIS — E119 Type 2 diabetes mellitus without complications: Secondary | ICD-10-CM | POA: Insufficient documentation

## 2015-01-30 DIAGNOSIS — Z48812 Encounter for surgical aftercare following surgery on the circulatory system: Secondary | ICD-10-CM | POA: Diagnosis not present

## 2015-02-02 ENCOUNTER — Encounter (HOSPITAL_COMMUNITY): Payer: Non-veteran care

## 2015-02-04 ENCOUNTER — Encounter (HOSPITAL_COMMUNITY)
Admission: RE | Admit: 2015-02-04 | Discharge: 2015-02-04 | Disposition: A | Discharge status: Home / Self Care | Payer: Non-veteran care | Source: Ambulatory Visit | Attending: Cardiology | Admitting: Cardiology

## 2015-02-04 DIAGNOSIS — Z48812 Encounter for surgical aftercare following surgery on the circulatory system: Secondary | ICD-10-CM | POA: Diagnosis not present

## 2015-02-06 ENCOUNTER — Encounter (HOSPITAL_COMMUNITY)
Admission: RE | Admit: 2015-02-06 | Discharge: 2015-02-06 | Disposition: A | Discharge status: Home / Self Care | Payer: Non-veteran care | Source: Ambulatory Visit | Attending: Cardiology | Admitting: Cardiology

## 2015-02-06 DIAGNOSIS — Z48812 Encounter for surgical aftercare following surgery on the circulatory system: Secondary | ICD-10-CM | POA: Diagnosis not present

## 2015-02-09 ENCOUNTER — Encounter (HOSPITAL_COMMUNITY)
Admission: RE | Admit: 2015-02-09 | Discharge: 2015-02-09 | Disposition: A | Discharge status: Home / Self Care | Payer: Non-veteran care | Source: Ambulatory Visit | Attending: Cardiology | Admitting: Cardiology

## 2015-02-09 DIAGNOSIS — Z48812 Encounter for surgical aftercare following surgery on the circulatory system: Secondary | ICD-10-CM | POA: Diagnosis not present

## 2015-02-11 ENCOUNTER — Encounter (HOSPITAL_COMMUNITY)
Admission: RE | Admit: 2015-02-11 | Discharge: 2015-02-11 | Disposition: A | Discharge status: Home / Self Care | Payer: Non-veteran care | Source: Ambulatory Visit | Attending: Cardiology | Admitting: Cardiology

## 2015-02-11 DIAGNOSIS — Z48812 Encounter for surgical aftercare following surgery on the circulatory system: Secondary | ICD-10-CM | POA: Diagnosis not present

## 2015-02-13 ENCOUNTER — Encounter (HOSPITAL_COMMUNITY)
Admission: RE | Admit: 2015-02-13 | Discharge: 2015-02-13 | Disposition: A | Discharge status: Home / Self Care | Payer: Non-veteran care | Source: Ambulatory Visit | Attending: Cardiology | Admitting: Cardiology

## 2015-02-13 DIAGNOSIS — Z48812 Encounter for surgical aftercare following surgery on the circulatory system: Secondary | ICD-10-CM | POA: Diagnosis not present

## 2015-02-16 ENCOUNTER — Encounter (HOSPITAL_COMMUNITY)
Admission: RE | Admit: 2015-02-16 | Discharge: 2015-02-16 | Disposition: A | Discharge status: Home / Self Care | Payer: Non-veteran care | Source: Ambulatory Visit | Attending: Cardiology | Admitting: Cardiology

## 2015-02-16 DIAGNOSIS — Z48812 Encounter for surgical aftercare following surgery on the circulatory system: Secondary | ICD-10-CM | POA: Diagnosis not present

## 2015-02-18 ENCOUNTER — Encounter (HOSPITAL_COMMUNITY)
Admission: RE | Admit: 2015-02-18 | Discharge: 2015-02-18 | Disposition: A | Discharge status: Home / Self Care | Payer: Non-veteran care | Source: Ambulatory Visit | Attending: Cardiology | Admitting: Cardiology

## 2015-02-18 DIAGNOSIS — Z48812 Encounter for surgical aftercare following surgery on the circulatory system: Secondary | ICD-10-CM | POA: Diagnosis not present

## 2015-02-23 ENCOUNTER — Encounter (HOSPITAL_COMMUNITY)
Admission: RE | Admit: 2015-02-23 | Discharge: 2015-02-23 | Disposition: A | Discharge status: Home / Self Care | Payer: Non-veteran care | Source: Ambulatory Visit | Attending: Cardiology | Admitting: Cardiology

## 2015-02-23 DIAGNOSIS — Z48812 Encounter for surgical aftercare following surgery on the circulatory system: Secondary | ICD-10-CM | POA: Diagnosis not present

## 2015-02-23 NOTE — Progress Notes (Signed)
Pt graduated from cardiac rehab program today with completion of 36 exercise sessions in Phase II. Pt maintained good attendance and progressed nicely during his participation in rehab as evidenced by increased MET level.   Medication list reconciled. Repeat  PHQ score- 0 .  Pt has made significant lifestyle changes and should be commended for his success. Pt feels he has achieved his goals during cardiac rehab.   Pt plans to continue exercise in cardiac maintenance program. 

## 2015-03-16 ENCOUNTER — Encounter (HOSPITAL_COMMUNITY)
Admission: RE | Admit: 2015-03-16 | Discharge: 2015-03-16 | Disposition: A | Discharge status: Home / Self Care | Payer: Self-pay | Source: Ambulatory Visit | Attending: Cardiology | Admitting: Cardiology

## 2015-03-16 DIAGNOSIS — Z951 Presence of aortocoronary bypass graft: Secondary | ICD-10-CM | POA: Insufficient documentation

## 2015-03-18 ENCOUNTER — Encounter (HOSPITAL_COMMUNITY): Payer: Self-pay

## 2015-03-20 ENCOUNTER — Encounter (HOSPITAL_COMMUNITY): Payer: Self-pay

## 2015-03-23 ENCOUNTER — Encounter (HOSPITAL_COMMUNITY)
Admission: RE | Admit: 2015-03-23 | Discharge: 2015-03-23 | Disposition: A | Discharge status: Home / Self Care | Payer: Self-pay | Source: Ambulatory Visit | Attending: Cardiology | Admitting: Cardiology

## 2015-03-25 ENCOUNTER — Encounter (HOSPITAL_COMMUNITY)
Admission: RE | Admit: 2015-03-25 | Discharge: 2015-03-25 | Disposition: A | Discharge status: Home / Self Care | Payer: Self-pay | Source: Ambulatory Visit | Attending: Cardiology | Admitting: Cardiology

## 2015-03-27 ENCOUNTER — Encounter (HOSPITAL_COMMUNITY)
Admission: RE | Admit: 2015-03-27 | Discharge: 2015-03-27 | Disposition: A | Discharge status: Home / Self Care | Payer: Self-pay | Source: Ambulatory Visit | Attending: Cardiology | Admitting: Cardiology

## 2015-03-30 ENCOUNTER — Encounter (HOSPITAL_COMMUNITY)
Admission: RE | Admit: 2015-03-30 | Discharge: 2015-03-30 | Disposition: A | Discharge status: Home / Self Care | Payer: Self-pay | Source: Ambulatory Visit | Attending: Cardiology | Admitting: Cardiology

## 2015-04-01 ENCOUNTER — Encounter (HOSPITAL_COMMUNITY): Payer: Self-pay

## 2015-04-03 ENCOUNTER — Encounter (HOSPITAL_COMMUNITY)
Admission: RE | Admit: 2015-04-03 | Discharge: 2015-04-03 | Disposition: A | Discharge status: Home / Self Care | Payer: Self-pay | Source: Ambulatory Visit | Attending: Cardiology | Admitting: Cardiology

## 2015-04-03 DIAGNOSIS — Z48812 Encounter for surgical aftercare following surgery on the circulatory system: Secondary | ICD-10-CM | POA: Insufficient documentation

## 2015-04-03 DIAGNOSIS — Z951 Presence of aortocoronary bypass graft: Secondary | ICD-10-CM | POA: Insufficient documentation

## 2015-04-08 ENCOUNTER — Encounter (HOSPITAL_COMMUNITY)
Admission: RE | Admit: 2015-04-08 | Discharge: 2015-04-08 | Disposition: A | Discharge status: Home / Self Care | Payer: Self-pay | Source: Ambulatory Visit | Attending: Cardiology | Admitting: Cardiology

## 2015-04-10 ENCOUNTER — Encounter (HOSPITAL_COMMUNITY)
Admission: RE | Admit: 2015-04-10 | Discharge: 2015-04-10 | Disposition: A | Discharge status: Home / Self Care | Payer: Self-pay | Source: Ambulatory Visit | Attending: Cardiology | Admitting: Cardiology

## 2015-04-13 ENCOUNTER — Encounter (HOSPITAL_COMMUNITY)
Admission: RE | Admit: 2015-04-13 | Discharge: 2015-04-13 | Disposition: A | Discharge status: Home / Self Care | Payer: Self-pay | Source: Ambulatory Visit | Attending: Cardiology | Admitting: Cardiology

## 2015-04-15 ENCOUNTER — Encounter (HOSPITAL_COMMUNITY)
Admission: RE | Admit: 2015-04-15 | Discharge: 2015-04-15 | Disposition: A | Discharge status: Home / Self Care | Payer: Self-pay | Source: Ambulatory Visit | Attending: Cardiology | Admitting: Cardiology

## 2015-04-17 ENCOUNTER — Encounter (HOSPITAL_COMMUNITY)
Admission: RE | Admit: 2015-04-17 | Discharge: 2015-04-17 | Disposition: A | Discharge status: Home / Self Care | Payer: Self-pay | Source: Ambulatory Visit | Attending: Cardiology | Admitting: Cardiology

## 2015-04-20 ENCOUNTER — Encounter (HOSPITAL_COMMUNITY)
Admission: RE | Admit: 2015-04-20 | Discharge: 2015-04-20 | Disposition: A | Discharge status: Home / Self Care | Payer: Non-veteran care | Source: Ambulatory Visit | Attending: Cardiology | Admitting: Cardiology

## 2015-04-22 ENCOUNTER — Encounter (HOSPITAL_COMMUNITY)
Admission: RE | Admit: 2015-04-22 | Discharge: 2015-04-22 | Disposition: A | Discharge status: Home / Self Care | Payer: Self-pay | Source: Ambulatory Visit | Attending: Cardiology | Admitting: Cardiology

## 2015-04-24 ENCOUNTER — Encounter (HOSPITAL_COMMUNITY)
Admission: RE | Admit: 2015-04-24 | Discharge: 2015-04-24 | Disposition: A | Discharge status: Home / Self Care | Payer: Self-pay | Source: Ambulatory Visit | Attending: Cardiology | Admitting: Cardiology

## 2015-04-27 ENCOUNTER — Encounter (HOSPITAL_COMMUNITY)
Admission: RE | Admit: 2015-04-27 | Discharge: 2015-04-27 | Disposition: A | Discharge status: Home / Self Care | Payer: Self-pay | Source: Ambulatory Visit | Attending: Cardiology | Admitting: Cardiology

## 2015-04-29 ENCOUNTER — Encounter (HOSPITAL_COMMUNITY): Payer: Self-pay

## 2015-05-01 ENCOUNTER — Encounter (HOSPITAL_COMMUNITY)
Admission: RE | Admit: 2015-05-01 | Discharge: 2015-05-01 | Disposition: A | Discharge status: Home / Self Care | Payer: Self-pay | Source: Ambulatory Visit | Attending: Cardiology | Admitting: Cardiology

## 2015-05-04 ENCOUNTER — Encounter (HOSPITAL_COMMUNITY)
Admission: RE | Admit: 2015-05-04 | Discharge: 2015-05-04 | Disposition: A | Discharge status: Home / Self Care | Payer: Self-pay | Source: Ambulatory Visit | Attending: Cardiology | Admitting: Cardiology

## 2015-05-04 DIAGNOSIS — Z951 Presence of aortocoronary bypass graft: Secondary | ICD-10-CM | POA: Insufficient documentation

## 2015-05-04 DIAGNOSIS — Z48812 Encounter for surgical aftercare following surgery on the circulatory system: Secondary | ICD-10-CM | POA: Insufficient documentation

## 2015-05-06 ENCOUNTER — Encounter (HOSPITAL_COMMUNITY)
Admission: RE | Admit: 2015-05-06 | Discharge: 2015-05-06 | Disposition: A | Discharge status: Home / Self Care | Payer: Self-pay | Source: Ambulatory Visit | Attending: Cardiology | Admitting: Cardiology

## 2015-05-08 ENCOUNTER — Encounter (HOSPITAL_COMMUNITY): Payer: Self-pay

## 2015-05-11 ENCOUNTER — Encounter (HOSPITAL_COMMUNITY): Payer: Self-pay

## 2015-05-13 ENCOUNTER — Encounter (HOSPITAL_COMMUNITY)
Admission: RE | Admit: 2015-05-13 | Discharge: 2015-05-13 | Disposition: A | Discharge status: Home / Self Care | Payer: Self-pay | Source: Ambulatory Visit | Attending: Cardiology | Admitting: Cardiology

## 2015-05-15 ENCOUNTER — Encounter (HOSPITAL_COMMUNITY)
Admission: RE | Admit: 2015-05-15 | Discharge: 2015-05-15 | Disposition: A | Discharge status: Home / Self Care | Payer: Self-pay | Source: Ambulatory Visit | Attending: Cardiology | Admitting: Cardiology

## 2015-05-18 ENCOUNTER — Encounter (HOSPITAL_COMMUNITY): Payer: Self-pay

## 2015-05-20 ENCOUNTER — Encounter (HOSPITAL_COMMUNITY): Payer: Self-pay

## 2015-05-22 ENCOUNTER — Encounter (HOSPITAL_COMMUNITY): Payer: Self-pay

## 2015-05-25 ENCOUNTER — Encounter (HOSPITAL_COMMUNITY)
Admission: RE | Admit: 2015-05-25 | Discharge: 2015-05-25 | Disposition: A | Discharge status: Home / Self Care | Payer: Self-pay | Source: Ambulatory Visit | Attending: Cardiology | Admitting: Cardiology

## 2015-05-27 ENCOUNTER — Encounter (HOSPITAL_COMMUNITY)
Admission: RE | Admit: 2015-05-27 | Discharge: 2015-05-27 | Disposition: A | Discharge status: Home / Self Care | Payer: Self-pay | Source: Ambulatory Visit | Attending: Cardiology | Admitting: Cardiology

## 2015-05-29 ENCOUNTER — Encounter (HOSPITAL_COMMUNITY): Payer: Self-pay

## 2015-06-01 ENCOUNTER — Encounter (HOSPITAL_COMMUNITY): Admission: RE | Admit: 2015-06-01 | Payer: Non-veteran care | Source: Ambulatory Visit

## 2015-06-01 ENCOUNTER — Encounter (HOSPITAL_COMMUNITY): Payer: Self-pay | Admitting: Emergency Medicine

## 2015-06-01 ENCOUNTER — Emergency Department (HOSPITAL_COMMUNITY)
Admission: EM | Admit: 2015-06-01 | Discharge: 2015-06-01 | Disposition: A | Discharge status: Home / Self Care | Payer: BC Managed Care – PPO | Attending: Emergency Medicine | Admitting: Emergency Medicine

## 2015-06-01 ENCOUNTER — Emergency Department (HOSPITAL_COMMUNITY): Payer: BC Managed Care – PPO

## 2015-06-01 DIAGNOSIS — I10 Essential (primary) hypertension: Secondary | ICD-10-CM | POA: Diagnosis not present

## 2015-06-01 DIAGNOSIS — Z7982 Long term (current) use of aspirin: Secondary | ICD-10-CM | POA: Diagnosis not present

## 2015-06-01 DIAGNOSIS — N23 Unspecified renal colic: Secondary | ICD-10-CM | POA: Diagnosis not present

## 2015-06-01 DIAGNOSIS — N200 Calculus of kidney: Secondary | ICD-10-CM

## 2015-06-01 DIAGNOSIS — Z794 Long term (current) use of insulin: Secondary | ICD-10-CM | POA: Diagnosis not present

## 2015-06-01 DIAGNOSIS — N1330 Unspecified hydronephrosis: Secondary | ICD-10-CM | POA: Diagnosis not present

## 2015-06-01 DIAGNOSIS — E119 Type 2 diabetes mellitus without complications: Secondary | ICD-10-CM | POA: Diagnosis not present

## 2015-06-01 DIAGNOSIS — Z8669 Personal history of other diseases of the nervous system and sense organs: Secondary | ICD-10-CM | POA: Diagnosis not present

## 2015-06-01 DIAGNOSIS — R109 Unspecified abdominal pain: Secondary | ICD-10-CM

## 2015-06-01 DIAGNOSIS — Z79899 Other long term (current) drug therapy: Secondary | ICD-10-CM | POA: Diagnosis not present

## 2015-06-01 DIAGNOSIS — Z951 Presence of aortocoronary bypass graft: Secondary | ICD-10-CM | POA: Insufficient documentation

## 2015-06-01 DIAGNOSIS — R1084 Generalized abdominal pain: Secondary | ICD-10-CM | POA: Diagnosis present

## 2015-06-01 LAB — CBC
HCT: 40.8 % (ref 39.0–52.0)
Hemoglobin: 13.4 g/dL (ref 13.0–17.0)
MCH: 27.8 pg (ref 26.0–34.0)
MCHC: 32.8 g/dL (ref 30.0–36.0)
MCV: 84.6 fL (ref 78.0–100.0)
PLATELETS: 172 10*3/uL (ref 150–400)
RBC: 4.82 MIL/uL (ref 4.22–5.81)
RDW: 12.7 % (ref 11.5–15.5)
WBC: 9.8 10*3/uL (ref 4.0–10.5)

## 2015-06-01 LAB — COMPREHENSIVE METABOLIC PANEL
ALK PHOS: 81 U/L (ref 38–126)
ALT: 25 U/L (ref 17–63)
AST: 29 U/L (ref 15–41)
Albumin: 4.5 g/dL (ref 3.5–5.0)
Anion gap: 9 (ref 5–15)
BUN: 23 mg/dL — AB (ref 6–20)
CALCIUM: 10.1 mg/dL (ref 8.9–10.3)
CO2: 30 mmol/L (ref 22–32)
CREATININE: 1.41 mg/dL — AB (ref 0.61–1.24)
Chloride: 102 mmol/L (ref 101–111)
GFR calc non Af Amer: 52 mL/min — ABNORMAL LOW (ref >60)
Glucose, Bld: 171 mg/dL — ABNORMAL HIGH (ref 65–99)
Potassium: 4.5 mmol/L (ref 3.5–5.1)
SODIUM: 141 mmol/L (ref 135–145)
Total Bilirubin: 0.5 mg/dL (ref 0.3–1.2)
Total Protein: 7.9 g/dL (ref 6.5–8.1)

## 2015-06-01 LAB — URINALYSIS, ROUTINE W REFLEX MICROSCOPIC
BILIRUBIN URINE: NEGATIVE
GLUCOSE, UA: NEGATIVE mg/dL
KETONES UR: NEGATIVE mg/dL
LEUKOCYTES UA: NEGATIVE
Nitrite: NEGATIVE
PROTEIN: NEGATIVE mg/dL
Specific Gravity, Urine: 1.014 (ref 1.005–1.030)
Urobilinogen, UA: 0.2 mg/dL (ref 0.0–1.0)
pH: 7.5 (ref 5.0–8.0)

## 2015-06-01 LAB — I-STAT CG4 LACTIC ACID, ED: Lactic Acid, Venous: 1.89 mmol/L (ref 0.5–2.0)

## 2015-06-01 LAB — URINE MICROSCOPIC-ADD ON

## 2015-06-01 LAB — LIPASE, BLOOD: Lipase: 20 U/L (ref 11–51)

## 2015-06-01 MED ORDER — OXYCODONE-ACETAMINOPHEN 5-325 MG PO TABS: 1.0000 | ORAL_TABLET | Freq: Four times a day (QID) | ORAL | Status: AC | PRN

## 2015-06-01 MED ORDER — OXYCODONE-ACETAMINOPHEN 5-325 MG PO TABS: 1.0000 | ORAL_TABLET | Freq: Once | ORAL | Status: DC

## 2015-06-01 MED ORDER — TAMSULOSIN HCL 0.4 MG PO CAPS: 0.4000 mg | ORAL_CAPSULE | Freq: Every day | ORAL | Status: AC

## 2015-06-01 MED ORDER — OXYCODONE HCL 5 MG PO TABS
5.0000 mg | ORAL_TABLET | Freq: Once | ORAL | Status: AC
  Administered 2015-06-01: 5 mg via ORAL
  Filled 2015-06-01: qty 1

## 2015-06-01 MED ORDER — ONDANSETRON HCL 4 MG/2ML IJ SOLN
4.0000 mg | Freq: Once | INTRAMUSCULAR | Status: AC | PRN
  Administered 2015-06-01: 4 mg via INTRAVENOUS
  Filled 2015-06-01: qty 2

## 2015-06-01 MED ORDER — ACETAMINOPHEN 500 MG PO TABS
1000.0000 mg | ORAL_TABLET | Freq: Once | ORAL | Status: AC
  Administered 2015-06-01: 1000 mg via ORAL
  Filled 2015-06-01: qty 2

## 2015-06-01 MED ORDER — MORPHINE SULFATE (PF) 4 MG/ML IV SOLN
4.0000 mg | Freq: Once | INTRAVENOUS | Status: AC
  Administered 2015-06-01: 4 mg via INTRAVENOUS
  Filled 2015-06-01: qty 1

## 2015-06-01 NOTE — ED Provider Notes (Signed)
CSN: [id number]     Arrival date & time 06/01/15  4098 History   First MD Initiated Contact with Patient 06/01/15 0421     Chief Complaint  Patient presents with  . Abdominal Pain     (Consider location/radiation/quality/duration/timing/severity/associated sxs/prior Treatment) Patient is a 64 y.o. male presenting with abdominal pain. The history is provided by the patient.  Abdominal Pain Pain location:  Generalized Pain quality: aching and cramping   Pain radiates to:  Does not radiate Pain severity:  Moderate Onset quality:  Gradual Duration:  6 hours Timing:  Constant Progression:  Waxing and waning Chronicity:  Recurrent Context: not previous surgeries and not sick contacts   Relieved by:  Nothing Worsened by:  Nothing tried Ineffective treatments:  None tried Associated symptoms: diarrhea and vomiting   Associated symptoms: no fever and no shortness of breath   Risk factors: obesity     Past Medical History  Diagnosis Date  . Hypertension   . Diabetes mellitus   . Sleep apnea    Past Surgical History  Procedure Laterality Date  . Coronary artery bypass graft     Family History  Problem Relation Age of Onset  . Hypertension Other   . Diabetes Other    Social History  Substance Use Topics  . Smoking status: Never Smoker   . Smokeless tobacco: None  . Alcohol Use: No    Review of Systems  Constitutional: Negative for fever.  Respiratory: Negative for shortness of breath.   Gastrointestinal: Positive for vomiting, abdominal pain and diarrhea.  All other systems reviewed and are negative.     Allergies  Review of patient's allergies indicates no known allergies.  Home Medications   Prior to Admission medications   Medication Sig Start Date End Date Taking? Authorizing Provider  acetaminophen (TYLENOL) 325 MG tablet Take 500 mg by mouth every 8 (eight) hours as needed for moderate pain, fever or headache.     Historical Provider, MD  aspirin EC 81  MG tablet Take 81 mg by mouth daily.    Historical Provider, MD  atorvastatin (LIPITOR) 10 MG tablet Take 10 mg by mouth daily.    Historical Provider, MD  dextrose (GLUTOSE) 40 % gel Take 15 g by mouth once as needed (low blood sugar).    Historical Provider, MD  docusate sodium (COLACE) 100 MG capsule Take 100 mg by mouth daily as needed for mild constipation.    Historical Provider, MD  ferrous sulfate 325 (65 FE) MG tablet Take 325 mg by mouth daily with breakfast.    Historical Provider, MD  furosemide (LASIX) 20 MG tablet Take 20 mg by mouth 2 (two) times daily.    Historical Provider, MD  Insulin Glargine (LANTUS) 100 UNIT/ML Solostar Pen Inject 25 Units into the skin daily at 10 pm.    Historical Provider, MD  insulin regular (NOVOLIN R,HUMULIN R) 100 units/mL injection Inject 4 Units into the skin 3 (three) times daily before meals. Take if CBG's are greater than 100    Historical Provider, MD  loratadine (CLARITIN) 10 MG tablet Take 10 mg by mouth daily.    Historical Provider, MD  magnesium oxide (MAG-OX) 400 MG tablet Take 800 mg by mouth 2 (two) times daily.    Historical Provider, MD  metFORMIN (GLUCOPHAGE) 500 MG tablet Take 1,000 mg by mouth 2 (two) times daily with a meal.    Historical Provider, MD  metoprolol tartrate (LOPRESSOR) 25 MG tablet Take 25 mg by mouth 3 (  three) times daily.    Historical Provider, MD  Multiple Vitamin (MULTIVITAMIN WITH MINERALS) TABS Take 1 tablet by mouth daily.    Historical Provider, MD  olopatadine (PATANOL) 0.1 % ophthalmic solution Place 1 drop into both eyes 2 (two) times daily.    Historical Provider, MD  omeprazole (PRILOSEC) 20 MG capsule Take 20 mg by mouth daily. Takes 2 tablets 30 minutes before a meal    Historical Provider, MD  oxycodone (OXY-IR) 5 MG capsule Take 5 mg by mouth every 4 (four) hours as needed (take 1 tablet for pain level 5-7 out of 10, take 2 tablets for pain level 8-10 out of 10).    Historical Provider, MD  polyvinyl  alcohol (LIQUID TEARS) 1.4 % ophthalmic solution Place 1 drop into both eyes daily as needed. For dry eyes    Historical Provider, MD  potassium chloride (K-DUR) 10 MEQ tablet Take 20 mEq by mouth daily.    Historical Provider, MD   BP 155/73 mmHg  Pulse 84  Temp(Src) 97.4 F (36.3 C) (Oral)  Resp 26  Ht 5\' 7"  (1.702 m)  Wt 180 lb (81.647 kg)  BMI 28.19 kg/m2  SpO2 97% Physical Exam  Constitutional: He is oriented to person, place, and time. He appears well-developed and well-nourished. No distress.  HENT:  Head: Normocephalic and atraumatic.  Eyes: Conjunctivae are normal.  Neck: Neck supple. No tracheal deviation present.  Cardiovascular: Normal rate and regular rhythm.   Pulmonary/Chest: Effort normal. No respiratory distress.  Abdominal: Soft. He exhibits no distension. There is tenderness (mild diffuse lower). There is CVA tenderness (on right side). There is no rigidity and no guarding.  Neurological: He is alert and oriented to person, place, and time.  Skin: Skin is warm and dry.  Psychiatric: He has a normal mood and affect.    ED Course  Procedures (including critical care time) Labs Review Labs Reviewed  COMPREHENSIVE METABOLIC PANEL - Abnormal; Notable for the following:    Glucose, Bld 171 (*)    BUN 23 (*)    Creatinine, Ser 1.41 (*)    GFR calc non Af Amer 52 (*)    All other components within normal limits  URINALYSIS, ROUTINE W REFLEX MICROSCOPIC (NOT [facility]) - Abnormal; Notable for the following:    Hgb urine dipstick MODERATE (*)    All other components within normal limits  LIPASE, BLOOD  CBC  URINE MICROSCOPIC-ADD ON  I-STAT CG4 LACTIC ACID, ED    Imaging Review Ct Renal Stone Study  06/01/2015  CLINICAL DATA:  Initial evaluation for acute lower abdominal pain, nausea, vomiting. History kidney stones. EXAM: CT ABDOMEN AND PELVIS WITHOUT CONTRAST TECHNIQUE: Multidetector CT imaging of the abdomen and pelvis was performed following the standard  protocol without IV contrast. COMPARISON:  None. FINDINGS: Minimal subsegmental atelectasis present within the the partially visualized lung bases. 3 mm subpleural nodule present within the left lower lobe (series 6, image 5). Additional 4 mm nodule present within the right lower lobe (series 6, image 3). Partially visualized lungs are otherwise unremarkable. No pleural or pericardial effusion. Median sternotomy wires noted. Liver demonstrates a normal unenhanced appearance. Gallbladder within normal limits. No biliary dilatation. Spleen, adrenal glands, and pancreas demonstrate a normal unenhanced appearance. Left kidney unremarkable without evidence of nephrolithiasis or hydronephrosis. Tiny exophytic hyperdense lesion noted extending from the lower pole, which may reflect a small proteinaceous or hemorrhagic cyst. This is not well evaluated on this exam. No radiopaque calculi seen along the  course of the left ureter. There is no left-sided hydroureter. On the right, obstructive 4 mm stone present just distal to the right UVJ in the bladder lumen. There is mild right hydroureteronephrosis with prominent right perinephric and periureteral fat stranding. No others radiopaque stones seen within the dilated right ureter. There is an additional 3 mm nonobstructive stone within the upper pole the right kidney. Stomach is moderately distended with air and fluid present within the gastric lumen. Stomach otherwise unremarkable. No evidence for bowel obstruction. No acute inflammatory changes about the bowels. Mild colonic diverticulosis without evidence for acute diverticulitis. Bladder within normal limits. Prostate mildly enlarged measuring 5.3 cm in transverse diameter. No free air or fluid. No pathologically enlarged intra-abdominal or pelvic lymph nodes identified. Mild to moderate aorto bi-iliac atherosclerotic calcifications. No aneurysm. No acute osseous abnormality. No worrisome lytic or blastic osseous lesions.  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 IMPRESSION: 1. Obstructive 4 mm stone positioned in the bladder lumen just beyond the right UVJ with secondary mild right hydroureteronephrosis. No other obstructive radiopaque stones identified along the right ureter. 2. Additional 3 mm nonobstructive right renal calculus. 3. Mild colonic diverticulosis without evidence for acute diverticulitis. 4. 7 mm hyperdense partially exophytic hyperdense lesion extending from the left kidney. Finding is poorly evaluated on this noncontrast examination, but may reflect a small proteinaceous or hemorrhagic cyst. Further evaluation with renal mass protocol CT could be performed for complete evaluation. 5. Scattered subcentimeter pulmonary nodules within the partially visualized lung bases measuring up to 4 mm, indeterminate. If the patient is at high risk for bronchogenic carcinoma, follow-up chest CT at 1 year is recommended. If the patient is at low risk, no follow-up is needed. This recommendation follows the consensus statement: Guidelines for Management of Small Pulmonary Nodules Detected on CT Scans: A Statement from the [employer] Society as published in Radiology 2005; 237:395-400. Electronically Signed   By: Rise Mu M.D.   On: 06/01/2015 06:19   I have personally reviewed and evaluated these images and lab results as part of my medical decision-making.   EKG Interpretation None      MDM   Final diagnoses:  Abdominal pain  Kidney stone  Renal colic  Hydronephrosis, right    64 y.o. male presents with lower abdominal pain suddenly tonight while watching football. No infectious symptoms. Had some vomiting. Has h/o stones and microscopic hematuria evident. Pt uncomfortable on exam and given parenteral analgesia. No toradol d/t cardiac history. CT with obstructive 4mm stone at right UVJ with hydro in bladder lumen suggesting etiology of pain. Plan for flomax, analgesia,  urology f/u on non-emergent basis. Plan to follow up with PCP as needed and return precautions discussed for worsening or new concerning symptoms.     Lyndal Pulley, MD 06/01/15 [phone]

## 2015-06-01 NOTE — ED Notes (Signed)
Pt reports loose stool around 10pm tonight.  About 1 hour later started having lower abd pain.  Took Pepto Bismol and Milk of Magnesia without relief.  Also reports nausea and vomited clear liquid x 3.  Denies urinary complaints.

## 2015-06-01 NOTE — Discharge Instructions (Signed)
Dietary Guidelines to Help Prevent Kidney Stones Your risk of kidney stones can be decreased by adjusting the foods you eat. The most important thing you can do is drink enough fluid. You should drink enough fluid to keep your urine clear or pale yellow. The following guidelines provide specific information for the type of kidney stone you have had. GUIDELINES ACCORDING TO TYPE OF KIDNEY STONE Calcium Oxalate Kidney Stones  Reduce the amount of salt you eat. Foods that have a lot of salt cause your body to release excess calcium into your urine. The excess calcium can combine with a substance called oxalate to form kidney stones.  Reduce the amount of animal protein you eat if the amount you eat is excessive. Animal protein causes your body to release excess calcium into your urine. Ask your dietitian how much protein from animal sources you should be eating.  Avoid foods that are high in oxalates. If you take vitamins, they should have less than 500 mg of vitamin C. Your body turns vitamin C into oxalates. You do not need to avoid fruits and vegetables high in vitamin C. Calcium Phosphate Kidney Stones  Reduce the amount of salt you eat to help prevent the release of excess calcium into your urine.  Reduce the amount of animal protein you eat if the amount you eat is excessive. Animal protein causes your body to release excess calcium into your urine. Ask your dietitian how much protein from animal sources you should be eating.  Get enough calcium from food or take a calcium supplement (ask your dietitian for recommendations). Food sources of calcium that do not increase your risk of kidney stones include:  Broccoli.  Dairy products, such as cheese and yogurt.  Pudding. Uric Acid Kidney Stones  Do not have more than 6 oz of animal protein per day. FOOD SOURCES Animal Protein Sources  Meat (all types).  Poultry.  Eggs.  Fish, seafood. Foods High in Salt  Salt seasonings.  Soy  sauce.  Teriyaki sauce.  Cured and processed meats.  Salted crackers and snack foods.  Fast food.  Canned soups and most canned foods. Foods High in Oxalates  Grains:  Amaranth.  Barley.  Grits.  Wheat germ.  Bran.  Buckwheat flour.  All bran cereals.  Pretzels.  Whole wheat bread.  Vegetables:  Beans (wax).  Beets and beet greens.  Collard greens.  Eggplant.  Escarole.  Leeks.  Okra.  Parsley.  Rutabagas.  Spinach.  Swiss chard.  Tomato paste.  Fried potatoes.  Sweet potatoes.  Fruits:  Red currants.  Figs.  Kiwi.  Rhubarb.  Meat and Other Protein Sources:  Beans (dried).  Soy burgers and other soybean products.  Miso.  Nuts (peanuts, almonds, pecans, cashews, hazelnuts).  Nut butters.  Sesame seeds and tahini (paste made of sesame seeds).  Poppy seeds.  Beverages:  Chocolate drink mixes.  Soy milk.  Instant iced tea.  Juices made from high-oxalate fruits or vegetables.  Other:  Carob.  Chocolate.  Fruitcake.  Marmalades.   This information is not intended to replace advice given to you by your health care provider. Make sure you discuss any questions you have with your health care provider.   Document Released: 11/12/2010 Document Revised: 07/23/2013 Document Reviewed: 06/14/2013 Elsevier Interactive Patient Education 2016 [employer]  

## 2015-06-03 ENCOUNTER — Encounter (HOSPITAL_COMMUNITY)
Admission: RE | Admit: 2015-06-03 | Discharge: 2015-06-03 | Disposition: A | Discharge status: Home / Self Care | Payer: Self-pay | Source: Ambulatory Visit | Attending: Cardiology | Admitting: Cardiology

## 2015-06-03 DIAGNOSIS — Z951 Presence of aortocoronary bypass graft: Secondary | ICD-10-CM | POA: Insufficient documentation

## 2015-06-03 DIAGNOSIS — Z48812 Encounter for surgical aftercare following surgery on the circulatory system: Secondary | ICD-10-CM | POA: Insufficient documentation

## 2015-06-05 ENCOUNTER — Encounter (HOSPITAL_COMMUNITY)
Admission: RE | Admit: 2015-06-05 | Discharge: 2015-06-05 | Disposition: A | Discharge status: Home / Self Care | Payer: Self-pay | Source: Ambulatory Visit | Attending: Cardiology | Admitting: Cardiology

## 2015-06-08 ENCOUNTER — Encounter (HOSPITAL_COMMUNITY)
Admission: RE | Admit: 2015-06-08 | Discharge: 2015-06-08 | Disposition: A | Discharge status: Home / Self Care | Payer: Self-pay | Source: Ambulatory Visit | Attending: Cardiology | Admitting: Cardiology

## 2015-06-10 ENCOUNTER — Encounter (HOSPITAL_COMMUNITY)
Admission: RE | Admit: 2015-06-10 | Discharge: 2015-06-10 | Disposition: A | Discharge status: Home / Self Care | Payer: Self-pay | Source: Ambulatory Visit | Attending: Cardiology | Admitting: Cardiology

## 2015-06-12 ENCOUNTER — Encounter (HOSPITAL_COMMUNITY): Payer: Self-pay

## 2015-06-15 ENCOUNTER — Encounter (HOSPITAL_COMMUNITY)
Admission: RE | Admit: 2015-06-15 | Discharge: 2015-06-15 | Disposition: A | Discharge status: Home / Self Care | Payer: Self-pay | Source: Ambulatory Visit | Attending: Cardiology | Admitting: Cardiology

## 2015-06-17 ENCOUNTER — Encounter (HOSPITAL_COMMUNITY)
Admission: RE | Admit: 2015-06-17 | Discharge: 2015-06-17 | Disposition: A | Discharge status: Home / Self Care | Payer: Self-pay | Source: Ambulatory Visit | Attending: Cardiology | Admitting: Cardiology

## 2015-06-19 ENCOUNTER — Encounter (HOSPITAL_COMMUNITY): Payer: Self-pay

## 2015-06-22 ENCOUNTER — Encounter (HOSPITAL_COMMUNITY)
Admission: RE | Admit: 2015-06-22 | Discharge: 2015-06-22 | Disposition: A | Discharge status: Home / Self Care | Payer: Self-pay | Source: Ambulatory Visit | Attending: Cardiology | Admitting: Cardiology

## 2015-06-24 ENCOUNTER — Encounter (HOSPITAL_COMMUNITY): Payer: Self-pay

## 2015-06-29 ENCOUNTER — Encounter (HOSPITAL_COMMUNITY)
Admission: RE | Admit: 2015-06-29 | Discharge: 2015-06-29 | Disposition: A | Discharge status: Home / Self Care | Payer: Self-pay | Source: Ambulatory Visit | Attending: Cardiology | Admitting: Cardiology

## 2015-07-01 ENCOUNTER — Encounter (HOSPITAL_COMMUNITY)
Admission: RE | Admit: 2015-07-01 | Discharge: 2015-07-01 | Disposition: A | Discharge status: Home / Self Care | Payer: Self-pay | Source: Ambulatory Visit | Attending: Cardiology | Admitting: Cardiology

## 2015-07-03 ENCOUNTER — Encounter (HOSPITAL_COMMUNITY)
Admission: RE | Admit: 2015-07-03 | Discharge: 2015-07-03 | Disposition: A | Discharge status: Home / Self Care | Payer: Self-pay | Source: Ambulatory Visit | Attending: Cardiology | Admitting: Cardiology

## 2015-07-03 DIAGNOSIS — Z48812 Encounter for surgical aftercare following surgery on the circulatory system: Secondary | ICD-10-CM | POA: Insufficient documentation

## 2015-07-03 DIAGNOSIS — Z951 Presence of aortocoronary bypass graft: Secondary | ICD-10-CM | POA: Insufficient documentation

## 2015-07-06 ENCOUNTER — Encounter (HOSPITAL_COMMUNITY)
Admission: RE | Admit: 2015-07-06 | Discharge: 2015-07-06 | Disposition: A | Discharge status: Home / Self Care | Payer: Self-pay | Source: Ambulatory Visit | Attending: Cardiology | Admitting: Cardiology

## 2015-07-08 ENCOUNTER — Encounter (HOSPITAL_COMMUNITY)
Admission: RE | Admit: 2015-07-08 | Discharge: 2015-07-08 | Disposition: A | Discharge status: Home / Self Care | Payer: Self-pay | Source: Ambulatory Visit | Attending: Cardiology | Admitting: Cardiology

## 2015-07-10 ENCOUNTER — Encounter (HOSPITAL_COMMUNITY)
Admission: RE | Admit: 2015-07-10 | Discharge: 2015-07-10 | Disposition: A | Discharge status: Home / Self Care | Payer: Self-pay | Source: Ambulatory Visit | Attending: Cardiology | Admitting: Cardiology

## 2015-07-13 ENCOUNTER — Encounter (HOSPITAL_COMMUNITY)
Admission: RE | Admit: 2015-07-13 | Discharge: 2015-07-13 | Disposition: A | Discharge status: Home / Self Care | Payer: Self-pay | Source: Ambulatory Visit | Attending: Cardiology | Admitting: Cardiology

## 2015-07-15 ENCOUNTER — Encounter (HOSPITAL_COMMUNITY): Payer: Self-pay

## 2015-07-17 ENCOUNTER — Encounter (HOSPITAL_COMMUNITY): Payer: Self-pay

## 2015-07-20 ENCOUNTER — Encounter (HOSPITAL_COMMUNITY)
Admission: RE | Admit: 2015-07-20 | Discharge: 2015-07-20 | Disposition: A | Discharge status: Home / Self Care | Payer: Self-pay | Source: Ambulatory Visit | Attending: Cardiology | Admitting: Cardiology

## 2015-07-22 ENCOUNTER — Encounter (HOSPITAL_COMMUNITY)
Admission: RE | Admit: 2015-07-22 | Discharge: 2015-07-22 | Disposition: A | Discharge status: Home / Self Care | Payer: Self-pay | Source: Ambulatory Visit | Attending: Cardiology | Admitting: Cardiology

## 2015-07-24 ENCOUNTER — Encounter (HOSPITAL_COMMUNITY): Payer: Self-pay

## 2015-07-29 ENCOUNTER — Encounter (HOSPITAL_COMMUNITY)
Admission: RE | Admit: 2015-07-29 | Discharge: 2015-07-29 | Disposition: A | Discharge status: Home / Self Care | Payer: Self-pay | Source: Ambulatory Visit | Attending: Cardiology | Admitting: Cardiology

## 2015-07-31 ENCOUNTER — Encounter (HOSPITAL_COMMUNITY): Payer: Self-pay

## 2015-08-05 ENCOUNTER — Encounter (HOSPITAL_COMMUNITY)
Admission: RE | Admit: 2015-08-05 | Discharge: 2015-08-05 | Disposition: A | Discharge status: Home / Self Care | Payer: Self-pay | Source: Ambulatory Visit | Attending: Cardiology | Admitting: Cardiology

## 2015-08-05 DIAGNOSIS — Z48812 Encounter for surgical aftercare following surgery on the circulatory system: Secondary | ICD-10-CM | POA: Insufficient documentation

## 2015-08-05 DIAGNOSIS — Z951 Presence of aortocoronary bypass graft: Secondary | ICD-10-CM | POA: Insufficient documentation

## 2015-08-07 ENCOUNTER — Encounter (HOSPITAL_COMMUNITY)
Admission: RE | Admit: 2015-08-07 | Discharge: 2015-08-07 | Disposition: A | Discharge status: Home / Self Care | Payer: Self-pay | Source: Ambulatory Visit | Attending: Cardiology | Admitting: Cardiology

## 2015-08-10 ENCOUNTER — Encounter (HOSPITAL_COMMUNITY): Payer: Self-pay

## 2015-08-12 ENCOUNTER — Encounter (HOSPITAL_COMMUNITY)
Admission: RE | Admit: 2015-08-12 | Discharge: 2015-08-12 | Disposition: A | Discharge status: Home / Self Care | Payer: Self-pay | Source: Ambulatory Visit | Attending: Cardiology | Admitting: Cardiology

## 2015-08-14 ENCOUNTER — Encounter (HOSPITAL_COMMUNITY)
Admission: RE | Admit: 2015-08-14 | Discharge: 2015-08-14 | Disposition: A | Discharge status: Home / Self Care | Payer: Self-pay | Source: Ambulatory Visit | Attending: Cardiology | Admitting: Cardiology

## 2015-08-17 ENCOUNTER — Encounter (HOSPITAL_COMMUNITY): Payer: Self-pay

## 2015-08-19 ENCOUNTER — Encounter (HOSPITAL_COMMUNITY)
Admission: RE | Admit: 2015-08-19 | Discharge: 2015-08-19 | Disposition: A | Discharge status: Home / Self Care | Payer: Self-pay | Source: Ambulatory Visit | Attending: Cardiology | Admitting: Cardiology

## 2015-08-21 ENCOUNTER — Encounter (HOSPITAL_COMMUNITY): Payer: Self-pay

## 2015-08-24 ENCOUNTER — Encounter (HOSPITAL_COMMUNITY)
Admission: RE | Admit: 2015-08-24 | Discharge: 2015-08-24 | Disposition: A | Discharge status: Home / Self Care | Payer: Self-pay | Source: Ambulatory Visit | Attending: Cardiology | Admitting: Cardiology

## 2015-08-26 ENCOUNTER — Encounter (HOSPITAL_COMMUNITY): Payer: Self-pay

## 2015-08-28 ENCOUNTER — Encounter (HOSPITAL_COMMUNITY): Payer: Self-pay

## 2015-08-31 ENCOUNTER — Encounter (HOSPITAL_COMMUNITY)
Admission: RE | Admit: 2015-08-31 | Discharge: 2015-08-31 | Disposition: A | Discharge status: Home / Self Care | Payer: Self-pay | Source: Ambulatory Visit | Attending: Cardiology | Admitting: Cardiology

## 2015-09-02 ENCOUNTER — Encounter (HOSPITAL_COMMUNITY)
Admission: RE | Admit: 2015-09-02 | Discharge: 2015-09-02 | Disposition: A | Discharge status: Home / Self Care | Payer: Self-pay | Source: Ambulatory Visit | Attending: Cardiology | Admitting: Cardiology

## 2015-09-02 DIAGNOSIS — Z951 Presence of aortocoronary bypass graft: Secondary | ICD-10-CM | POA: Insufficient documentation

## 2015-09-04 ENCOUNTER — Encounter (HOSPITAL_COMMUNITY): Payer: Self-pay

## 2015-09-07 ENCOUNTER — Encounter (HOSPITAL_COMMUNITY): Payer: Self-pay

## 2015-09-09 ENCOUNTER — Telehealth (HOSPITAL_COMMUNITY): Payer: Self-pay | Admitting: *Deleted

## 2015-09-09 ENCOUNTER — Encounter (HOSPITAL_COMMUNITY): Admission: RE | Admit: 2015-09-09 | Payer: Self-pay | Source: Ambulatory Visit

## 2015-09-11 ENCOUNTER — Encounter (HOSPITAL_COMMUNITY): Payer: Self-pay

## 2015-09-14 ENCOUNTER — Encounter (HOSPITAL_COMMUNITY): Payer: Self-pay

## 2015-09-16 ENCOUNTER — Encounter (HOSPITAL_COMMUNITY)
Admission: RE | Admit: 2015-09-16 | Discharge: 2015-09-16 | Disposition: A | Discharge status: Home / Self Care | Payer: Self-pay | Source: Ambulatory Visit | Attending: Cardiology | Admitting: Cardiology

## 2015-09-18 ENCOUNTER — Encounter (HOSPITAL_COMMUNITY)
Admission: RE | Admit: 2015-09-18 | Discharge: 2015-09-18 | Disposition: A | Discharge status: Home / Self Care | Payer: Self-pay | Source: Ambulatory Visit | Attending: Cardiology | Admitting: Cardiology

## 2015-09-21 ENCOUNTER — Encounter (HOSPITAL_COMMUNITY)
Admission: RE | Admit: 2015-09-21 | Discharge: 2015-09-21 | Disposition: A | Discharge status: Home / Self Care | Payer: Self-pay | Source: Ambulatory Visit | Attending: Cardiology | Admitting: Cardiology

## 2015-09-23 ENCOUNTER — Encounter (HOSPITAL_COMMUNITY): Payer: Self-pay

## 2015-09-25 ENCOUNTER — Encounter (HOSPITAL_COMMUNITY): Payer: Self-pay

## 2015-09-28 ENCOUNTER — Encounter (HOSPITAL_COMMUNITY)
Admission: RE | Admit: 2015-09-28 | Discharge: 2015-09-28 | Disposition: A | Discharge status: Home / Self Care | Payer: Self-pay | Source: Ambulatory Visit | Attending: Cardiology | Admitting: Cardiology

## 2015-09-30 ENCOUNTER — Encounter (HOSPITAL_COMMUNITY): Payer: Self-pay

## 2015-09-30 DIAGNOSIS — Z951 Presence of aortocoronary bypass graft: Secondary | ICD-10-CM | POA: Insufficient documentation

## 2015-10-02 ENCOUNTER — Encounter (HOSPITAL_COMMUNITY): Payer: Self-pay

## 2015-10-05 ENCOUNTER — Encounter (HOSPITAL_COMMUNITY): Payer: Self-pay

## 2015-10-07 ENCOUNTER — Encounter (HOSPITAL_COMMUNITY): Payer: Non-veteran care

## 2015-10-09 ENCOUNTER — Encounter (HOSPITAL_COMMUNITY): Payer: Self-pay

## 2015-10-12 ENCOUNTER — Encounter (HOSPITAL_COMMUNITY)
Admission: RE | Admit: 2015-10-12 | Discharge: 2015-10-12 | Disposition: A | Discharge status: Home / Self Care | Payer: Self-pay | Source: Ambulatory Visit | Attending: Cardiology | Admitting: Cardiology

## 2015-10-14 ENCOUNTER — Encounter (HOSPITAL_COMMUNITY): Payer: Self-pay

## 2015-10-16 ENCOUNTER — Encounter (HOSPITAL_COMMUNITY): Payer: Self-pay

## 2015-10-19 ENCOUNTER — Encounter (HOSPITAL_COMMUNITY)
Admission: RE | Admit: 2015-10-19 | Discharge: 2015-10-19 | Disposition: A | Discharge status: Home / Self Care | Payer: Self-pay | Source: Ambulatory Visit | Attending: Cardiology | Admitting: Cardiology

## 2015-10-21 ENCOUNTER — Encounter (HOSPITAL_COMMUNITY): Payer: Self-pay

## 2015-10-23 ENCOUNTER — Encounter (HOSPITAL_COMMUNITY): Payer: Self-pay

## 2015-10-26 ENCOUNTER — Encounter (HOSPITAL_COMMUNITY): Payer: Self-pay

## 2015-10-28 ENCOUNTER — Encounter (HOSPITAL_COMMUNITY): Payer: Self-pay

## 2015-10-30 ENCOUNTER — Encounter (HOSPITAL_COMMUNITY): Payer: Self-pay

## 2015-11-02 ENCOUNTER — Encounter (HOSPITAL_COMMUNITY)
Admission: RE | Admit: 2015-11-02 | Discharge: 2015-11-02 | Disposition: A | Discharge status: Home / Self Care | Payer: Self-pay | Source: Ambulatory Visit | Attending: Cardiology | Admitting: Cardiology

## 2015-11-02 DIAGNOSIS — Z951 Presence of aortocoronary bypass graft: Secondary | ICD-10-CM | POA: Insufficient documentation

## 2015-11-04 ENCOUNTER — Encounter (HOSPITAL_COMMUNITY)
Admission: RE | Admit: 2015-11-04 | Discharge: 2015-11-04 | Disposition: A | Discharge status: Home / Self Care | Payer: Self-pay | Source: Ambulatory Visit | Attending: Cardiology | Admitting: Cardiology

## 2015-11-06 ENCOUNTER — Encounter (HOSPITAL_COMMUNITY): Payer: Self-pay

## 2015-11-09 ENCOUNTER — Encounter (HOSPITAL_COMMUNITY)
Admission: RE | Admit: 2015-11-09 | Discharge: 2015-11-09 | Disposition: A | Discharge status: Home / Self Care | Payer: Self-pay | Source: Ambulatory Visit | Attending: Cardiology | Admitting: Cardiology

## 2015-11-11 ENCOUNTER — Encounter (HOSPITAL_COMMUNITY)
Admission: RE | Admit: 2015-11-11 | Discharge: 2015-11-11 | Disposition: A | Discharge status: Home / Self Care | Payer: Self-pay | Source: Ambulatory Visit | Attending: Cardiology | Admitting: Cardiology

## 2015-11-13 ENCOUNTER — Encounter (HOSPITAL_COMMUNITY): Payer: Self-pay

## 2015-11-16 ENCOUNTER — Encounter (HOSPITAL_COMMUNITY)
Admission: RE | Admit: 2015-11-16 | Discharge: 2015-11-16 | Disposition: A | Discharge status: Home / Self Care | Payer: Self-pay | Source: Ambulatory Visit | Attending: Cardiology | Admitting: Cardiology

## 2015-11-18 ENCOUNTER — Encounter (HOSPITAL_COMMUNITY)
Admission: RE | Admit: 2015-11-18 | Discharge: 2015-11-18 | Disposition: A | Discharge status: Home / Self Care | Payer: Self-pay | Source: Ambulatory Visit | Attending: Cardiology | Admitting: Cardiology

## 2015-11-20 ENCOUNTER — Encounter (HOSPITAL_COMMUNITY)
Admission: RE | Admit: 2015-11-20 | Discharge: 2015-11-20 | Disposition: A | Discharge status: Home / Self Care | Payer: Self-pay | Source: Ambulatory Visit | Attending: Cardiology | Admitting: Cardiology

## 2015-11-23 ENCOUNTER — Encounter (HOSPITAL_COMMUNITY)
Admission: RE | Admit: 2015-11-23 | Discharge: 2015-11-23 | Disposition: A | Discharge status: Home / Self Care | Payer: Self-pay | Source: Ambulatory Visit | Attending: Cardiology | Admitting: Cardiology

## 2015-11-25 ENCOUNTER — Encounter (HOSPITAL_COMMUNITY)
Admission: RE | Admit: 2015-11-25 | Discharge: 2015-11-25 | Disposition: A | Discharge status: Home / Self Care | Payer: Self-pay | Source: Ambulatory Visit | Attending: Cardiology | Admitting: Cardiology

## 2015-11-27 ENCOUNTER — Encounter (HOSPITAL_COMMUNITY): Payer: Self-pay

## 2015-11-30 ENCOUNTER — Encounter (HOSPITAL_COMMUNITY): Payer: Self-pay

## 2015-11-30 DIAGNOSIS — Z951 Presence of aortocoronary bypass graft: Secondary | ICD-10-CM | POA: Insufficient documentation

## 2015-12-02 ENCOUNTER — Encounter (HOSPITAL_COMMUNITY): Payer: Self-pay

## 2015-12-04 ENCOUNTER — Encounter (HOSPITAL_COMMUNITY)
Admission: RE | Admit: 2015-12-04 | Discharge: 2015-12-04 | Disposition: A | Discharge status: Home / Self Care | Payer: Self-pay | Source: Ambulatory Visit | Attending: Cardiology | Admitting: Cardiology

## 2015-12-07 ENCOUNTER — Encounter (HOSPITAL_COMMUNITY): Payer: Self-pay

## 2015-12-09 ENCOUNTER — Encounter (HOSPITAL_COMMUNITY): Payer: Self-pay

## 2015-12-11 ENCOUNTER — Encounter (HOSPITAL_COMMUNITY): Payer: Self-pay

## 2015-12-14 ENCOUNTER — Encounter (HOSPITAL_COMMUNITY): Payer: Self-pay

## 2015-12-16 ENCOUNTER — Encounter (HOSPITAL_COMMUNITY)
Admission: RE | Admit: 2015-12-16 | Discharge: 2015-12-16 | Disposition: A | Discharge status: Home / Self Care | Payer: Self-pay | Source: Ambulatory Visit | Attending: Cardiology | Admitting: Cardiology

## 2015-12-18 ENCOUNTER — Encounter (HOSPITAL_COMMUNITY)
Admission: RE | Admit: 2015-12-18 | Discharge: 2015-12-18 | Disposition: A | Discharge status: Home / Self Care | Payer: Self-pay | Source: Ambulatory Visit | Attending: Cardiology | Admitting: Cardiology

## 2015-12-21 ENCOUNTER — Encounter (HOSPITAL_COMMUNITY): Payer: Self-pay

## 2015-12-23 ENCOUNTER — Encounter (HOSPITAL_COMMUNITY)
Admission: RE | Admit: 2015-12-23 | Discharge: 2015-12-23 | Disposition: A | Discharge status: Home / Self Care | Payer: Self-pay | Source: Ambulatory Visit | Attending: Cardiology | Admitting: Cardiology

## 2015-12-25 ENCOUNTER — Encounter (HOSPITAL_COMMUNITY)
Admission: RE | Admit: 2015-12-25 | Discharge: 2015-12-25 | Disposition: A | Discharge status: Home / Self Care | Payer: Self-pay | Source: Ambulatory Visit | Attending: Cardiology | Admitting: Cardiology

## 2015-12-30 ENCOUNTER — Encounter (HOSPITAL_COMMUNITY): Payer: Self-pay

## 2016-01-01 ENCOUNTER — Encounter (HOSPITAL_COMMUNITY): Payer: Self-pay

## 2016-01-04 ENCOUNTER — Encounter (HOSPITAL_COMMUNITY): Payer: Self-pay

## 2016-01-06 ENCOUNTER — Encounter (HOSPITAL_COMMUNITY): Payer: Self-pay

## 2016-01-08 ENCOUNTER — Encounter (HOSPITAL_COMMUNITY): Payer: Self-pay

## 2016-01-11 ENCOUNTER — Encounter (HOSPITAL_COMMUNITY): Payer: Self-pay

## 2016-01-13 ENCOUNTER — Encounter (HOSPITAL_COMMUNITY): Payer: Self-pay

## 2016-01-15 ENCOUNTER — Encounter (HOSPITAL_COMMUNITY): Payer: Self-pay

## 2016-01-18 ENCOUNTER — Encounter (HOSPITAL_COMMUNITY): Payer: Self-pay

## 2016-01-20 ENCOUNTER — Encounter (HOSPITAL_COMMUNITY): Payer: Self-pay

## 2016-01-22 ENCOUNTER — Encounter (HOSPITAL_COMMUNITY): Payer: Self-pay

## 2016-01-25 ENCOUNTER — Encounter (HOSPITAL_COMMUNITY): Payer: Self-pay

## 2016-01-27 ENCOUNTER — Encounter (HOSPITAL_COMMUNITY): Payer: Self-pay

## 2016-01-29 ENCOUNTER — Encounter (HOSPITAL_COMMUNITY): Payer: Self-pay

## 2016-02-01 ENCOUNTER — Encounter (HOSPITAL_COMMUNITY): Payer: Self-pay

## 2016-02-03 ENCOUNTER — Encounter (HOSPITAL_COMMUNITY): Payer: Self-pay

## 2016-02-05 ENCOUNTER — Encounter (HOSPITAL_COMMUNITY): Payer: Self-pay

## 2016-02-08 ENCOUNTER — Encounter (HOSPITAL_COMMUNITY): Payer: Self-pay

## 2016-02-10 ENCOUNTER — Encounter (HOSPITAL_COMMUNITY): Payer: Self-pay

## 2016-02-12 ENCOUNTER — Encounter (HOSPITAL_COMMUNITY): Payer: Self-pay

## 2016-02-15 ENCOUNTER — Encounter (HOSPITAL_COMMUNITY): Payer: Self-pay

## 2016-02-17 ENCOUNTER — Encounter (HOSPITAL_COMMUNITY): Payer: Self-pay

## 2016-02-19 ENCOUNTER — Encounter (HOSPITAL_COMMUNITY): Payer: Self-pay

## 2016-02-22 ENCOUNTER — Encounter (HOSPITAL_COMMUNITY): Payer: Self-pay

## 2016-02-24 ENCOUNTER — Encounter (HOSPITAL_COMMUNITY): Payer: Self-pay

## 2016-02-26 ENCOUNTER — Encounter (HOSPITAL_COMMUNITY): Payer: Self-pay

## 2016-02-29 ENCOUNTER — Encounter (HOSPITAL_COMMUNITY): Payer: Self-pay

## 2016-03-02 ENCOUNTER — Encounter (HOSPITAL_COMMUNITY): Payer: Self-pay

## 2016-03-04 ENCOUNTER — Encounter (HOSPITAL_COMMUNITY): Payer: Self-pay

## 2016-03-07 ENCOUNTER — Encounter (HOSPITAL_COMMUNITY): Payer: Self-pay

## 2016-03-09 ENCOUNTER — Encounter (HOSPITAL_COMMUNITY): Payer: Self-pay

## 2016-03-11 ENCOUNTER — Encounter (HOSPITAL_COMMUNITY): Payer: Self-pay

## 2016-03-14 ENCOUNTER — Encounter (HOSPITAL_COMMUNITY): Payer: Self-pay

## 2016-03-16 ENCOUNTER — Encounter (HOSPITAL_COMMUNITY): Payer: Self-pay

## 2016-03-18 ENCOUNTER — Encounter (HOSPITAL_COMMUNITY): Payer: Self-pay

## 2016-03-21 ENCOUNTER — Encounter (HOSPITAL_COMMUNITY): Payer: Self-pay

## 2016-03-23 ENCOUNTER — Encounter (HOSPITAL_COMMUNITY): Payer: Self-pay

## 2016-03-25 ENCOUNTER — Encounter (HOSPITAL_COMMUNITY): Payer: Self-pay

## 2016-03-28 ENCOUNTER — Encounter (HOSPITAL_COMMUNITY): Payer: Self-pay

## 2016-03-30 ENCOUNTER — Encounter (HOSPITAL_COMMUNITY): Payer: Self-pay

## 2016-08-23 IMAGING — CT CT RENAL STONE PROTOCOL
2 of 4 series · 14 of 46 positions shown, 16 images · non-contrast
Comparison: None.

CLINICAL DATA: Initial evaluation for acute lower abdominal pain,
nausea, vomiting. History kidney stones.

EXAM:
CT ABDOMEN AND PELVIS WITHOUT CONTRAST
TECHNIQUE: Multidetector CT imaging of the abdomen and pelvis was performed
following the standard protocol without IV contrast.

[Series 2: renal stone 5mm · axial · 0.87mm/px · z∈[+803,+1193]mm · 11 of 94 slices shown, 13 images]
[im 8/94  soft-tissue]
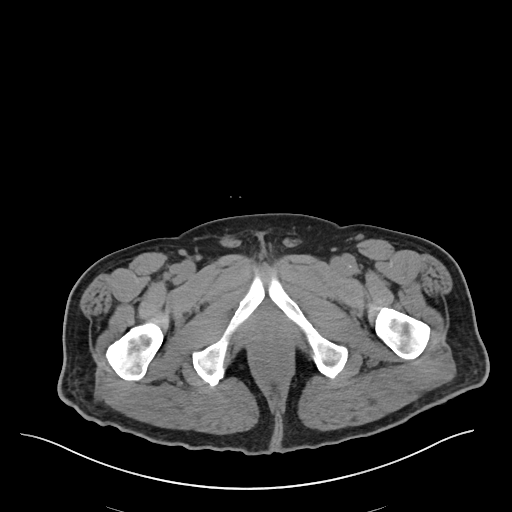
[im 8/94  bone]
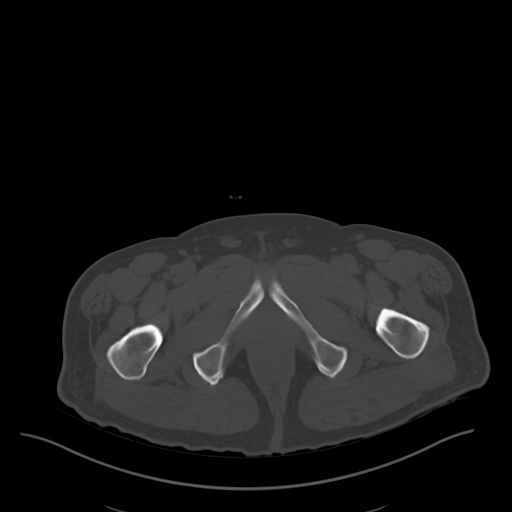
[im 16/94  soft-tissue]
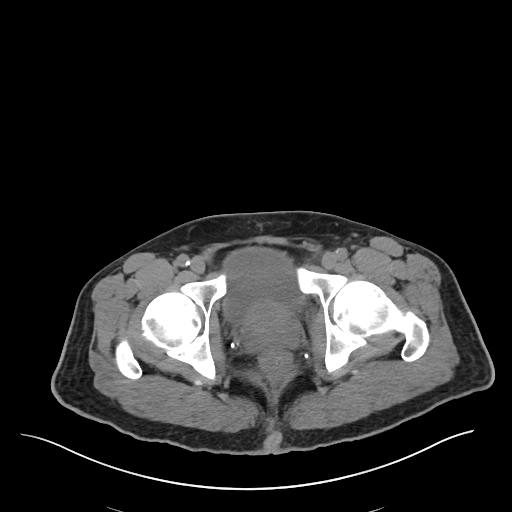
[im 24/94  soft-tissue]
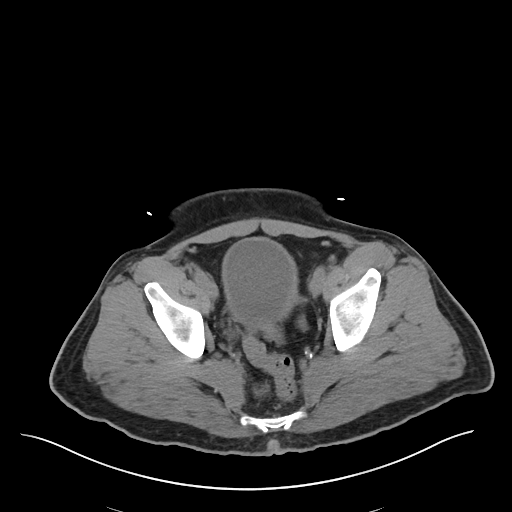
[im 32/94  soft-tissue]
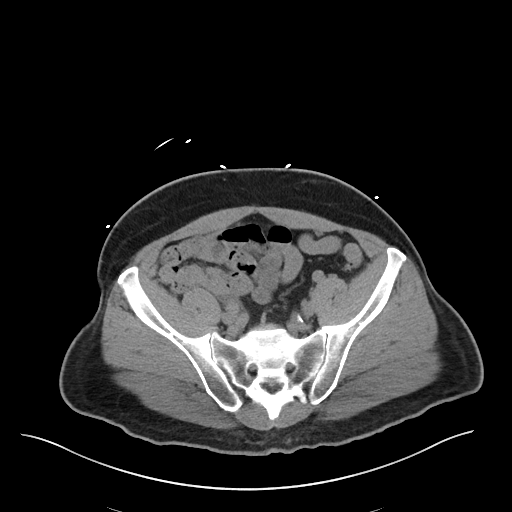
[im 39/94  soft-tissue]
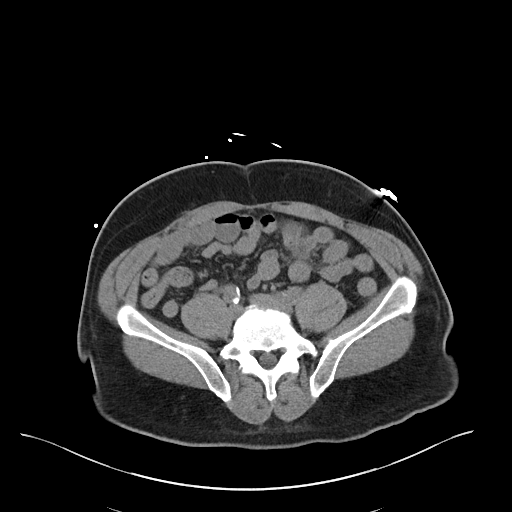
[im 47/94  soft-tissue]
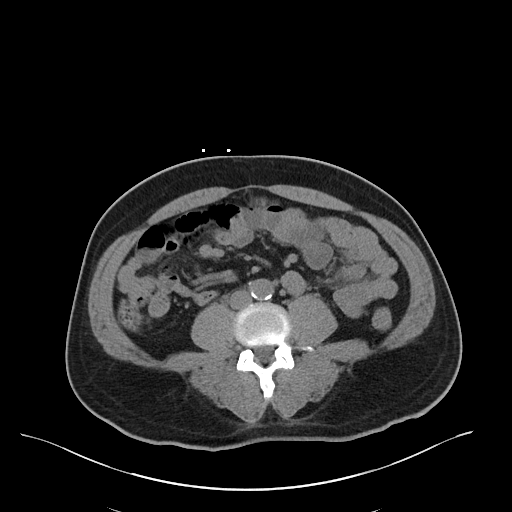
[im 55/94  soft-tissue]
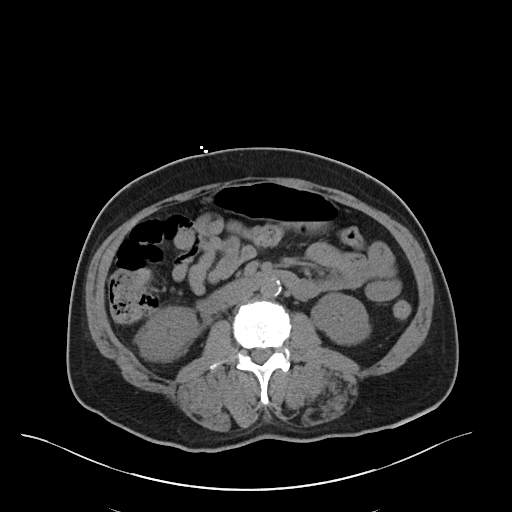
[im 63/94  soft-tissue]
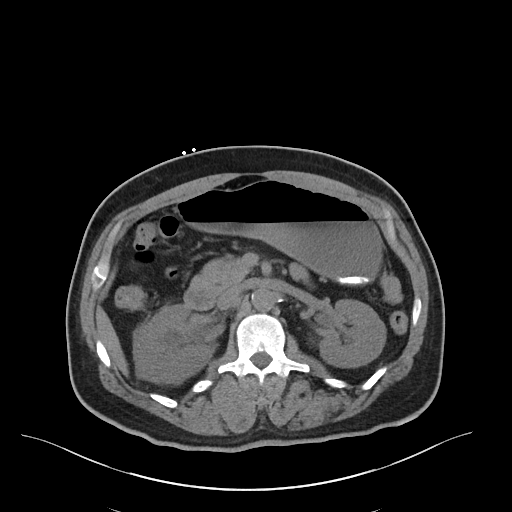
[im 70/94  soft-tissue]
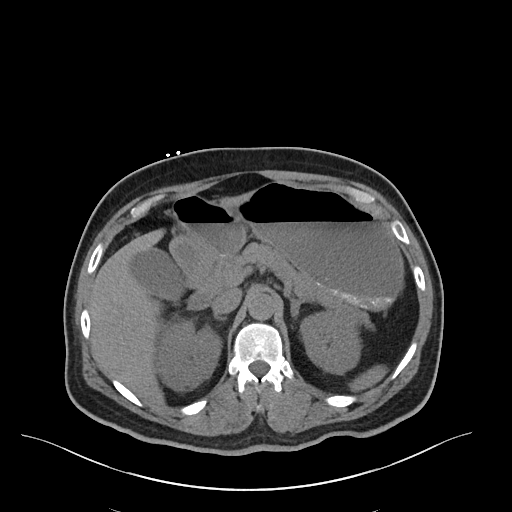
[im 70/94  bone]
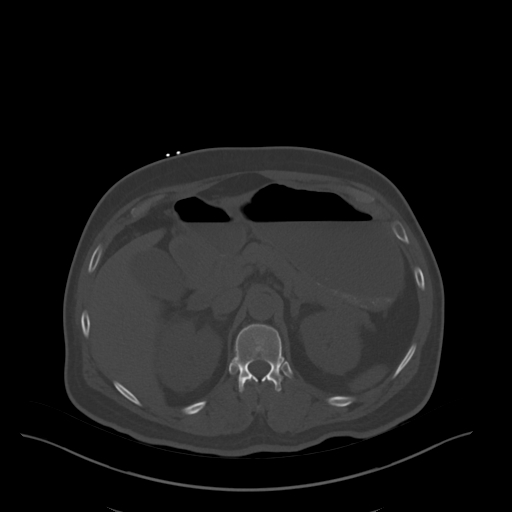
[im 78/94  soft-tissue]
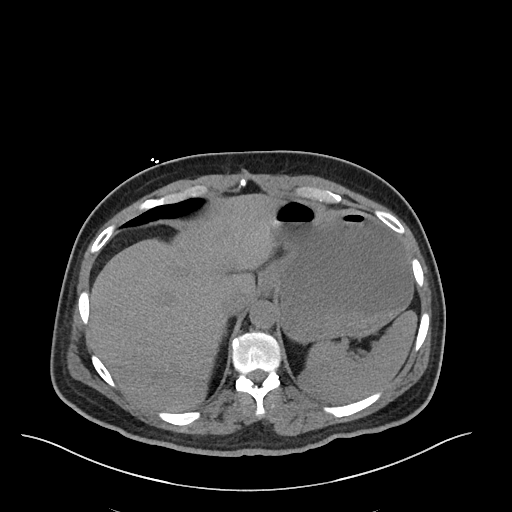
[im 86/94  soft-tissue]
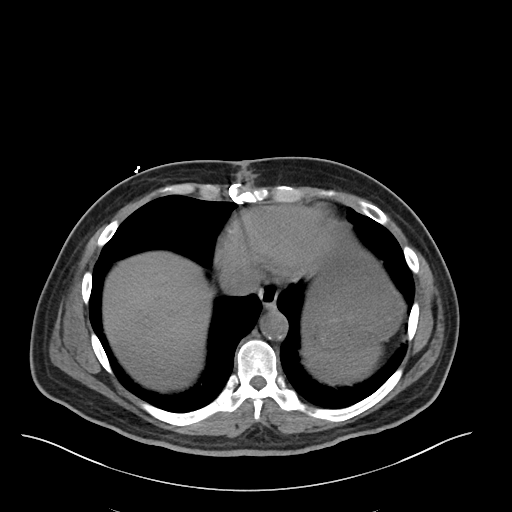

[Series 4: renal stone 3.0 cor · coronal · 0.68mm/px · 3 of 94 slices shown]
[im 32/94  soft-tissue]
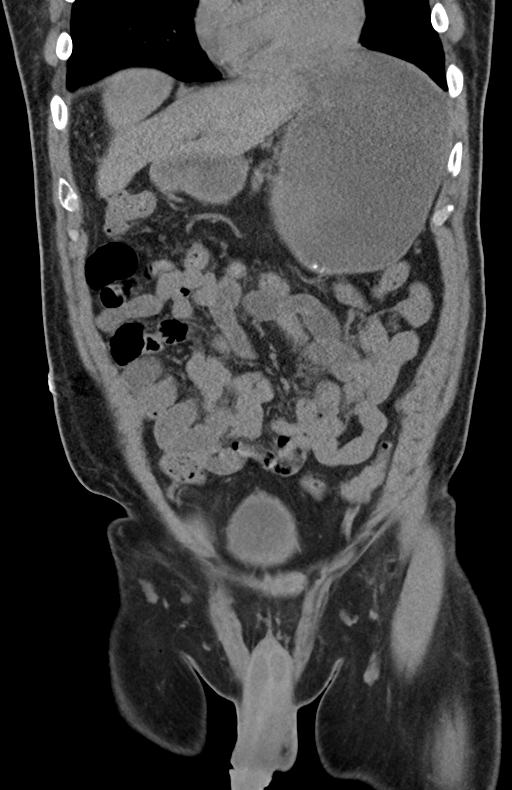
[im 42/94  soft-tissue]
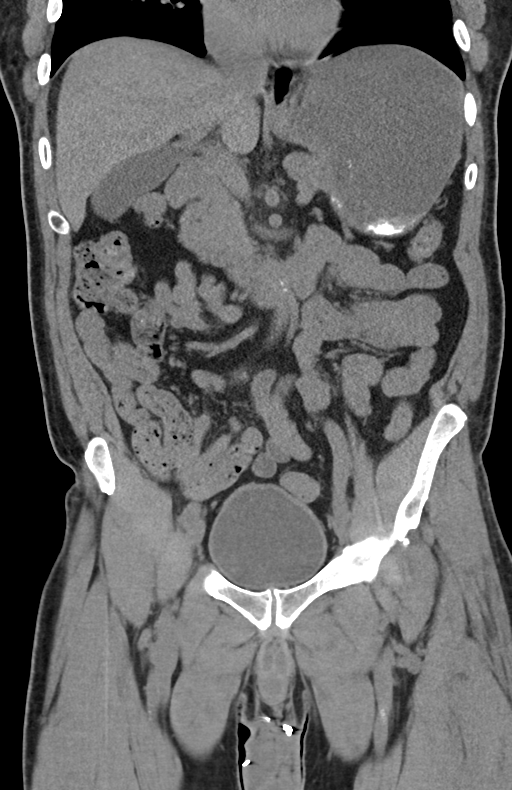
[im 52/94  soft-tissue]
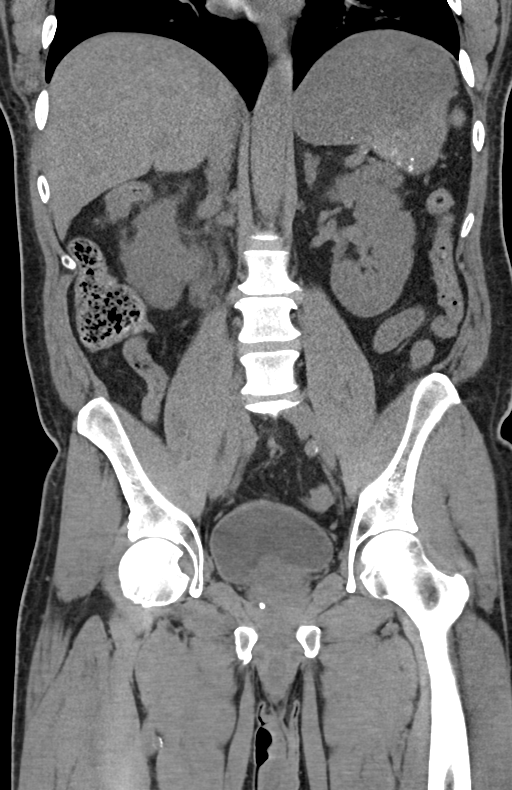

[14 of 46 positions shown; findings below may reference images not displayed]

FINDINGS: Minimal subsegmental atelectasis present within the the partially
visualized lung bases. 3 mm subpleural nodule present within the
left lower lobe (series 6, image 5). Additional 4 mm nodule present
within the right lower lobe (series 6, image 3). Partially
visualized lungs are otherwise unremarkable. No pleural or
pericardial effusion. Median sternotomy wires noted.

Liver demonstrates a normal unenhanced appearance. Gallbladder
within normal limits. No biliary dilatation. Spleen, adrenal glands,
and pancreas demonstrate a normal unenhanced appearance.

Left kidney unremarkable without evidence of nephrolithiasis or
hydronephrosis. Tiny exophytic hyperdense lesion noted extending
from the lower pole, which may reflect a small proteinaceous or
hemorrhagic cyst. This is not well evaluated on this exam. No
radiopaque calculi seen along the course of the left ureter. There
is no left-sided hydroureter.

On the right, obstructive 4 mm stone present just distal to the
right UVJ in the bladder lumen. There is mild right
hydroureteronephrosis with prominent right perinephric and
periureteral fat stranding. No others radiopaque stones seen within
the dilated right ureter. There is an additional 3 mm nonobstructive
stone within the upper pole the right kidney.

Stomach is moderately distended with air and fluid present within
the gastric lumen. Stomach otherwise unremarkable. No evidence for
bowel obstruction. No acute inflammatory changes about the bowels.
Mild colonic diverticulosis without evidence for acute
diverticulitis.

Bladder within normal limits. Prostate mildly enlarged measuring
cm in transverse diameter.

No free air or fluid. No pathologically enlarged intra-abdominal or
pelvic lymph nodes identified. Mild to moderate aorto bi-iliac
atherosclerotic calcifications. No aneurysm.

No acute osseous abnormality. No worrisome lytic or blastic osseous
lesions. Chronic bilateral pars defects present at L2 without
associated listhesis.
IMPRESSION: 1. Obstructive 4 mm stone positioned in the bladder lumen just
beyond the right UVJ with secondary mild right
hydroureteronephrosis. No other obstructive radiopaque stones
identified along the right ureter.
2. Additional 3 mm nonobstructive right renal calculus.
3. Mild colonic diverticulosis without evidence for acute
diverticulitis.
4. 7 mm hyperdense partially exophytic hyperdense lesion extending
from the left kidney. Finding is poorly evaluated on this
noncontrast examination, but may reflect a small proteinaceous or
hemorrhagic cyst. Further evaluation with renal mass protocol CT
could be performed for complete evaluation.
5. Scattered subcentimeter pulmonary nodules within the partially
visualized lung bases measuring up to 4 mm, indeterminate. If the
patient is at high risk for bronchogenic carcinoma, follow-up chest
CT at 1 year is recommended. If the patient is at low risk, no
follow-up is needed. This recommendation follows the consensus
statement: Guidelines for Management of Small Pulmonary Nodules
Detected on CT Scans: A Statement from the [HOSPITAL] as

## 2019-11-30 DEATH — deceased
